# Patient Record
Sex: Female | Born: 1990 | Hispanic: Yes | Smoking: Never smoker
Health system: Southern US, Community
[De-identification: ages and names within clinical notes are randomized; demographics above are authoritative.]

## PROBLEM LIST (undated history)

## (undated) DIAGNOSIS — F329 Major depressive disorder, single episode, unspecified: Secondary | ICD-10-CM

## (undated) DIAGNOSIS — F32A Depression, unspecified: Secondary | ICD-10-CM

## (undated) HISTORY — DX: Depression, unspecified: F32.A

## (undated) HISTORY — PX: NO PAST SURGERIES: SHX2092

## (undated) HISTORY — DX: Major depressive disorder, single episode, unspecified: F32.9

---

## 2017-12-08 ENCOUNTER — Ambulatory Visit: Payer: Self-pay | Admitting: Osteopathic Medicine

## 2017-12-08 ENCOUNTER — Ambulatory Visit (INDEPENDENT_AMBULATORY_CARE_PROVIDER_SITE_OTHER): Payer: 59 | Admitting: Osteopathic Medicine

## 2017-12-08 ENCOUNTER — Encounter: Payer: Self-pay | Admitting: Osteopathic Medicine

## 2017-12-08 VITALS — BP 135/72 | HR 76 | Temp 98.3°F | Ht 59.06 in | Wt 177.1 lb

## 2017-12-08 DIAGNOSIS — Z0000 Encounter for general adult medical examination without abnormal findings: Secondary | ICD-10-CM | POA: Diagnosis not present

## 2017-12-08 DIAGNOSIS — Z113 Encounter for screening for infections with a predominantly sexual mode of transmission: Secondary | ICD-10-CM

## 2017-12-08 DIAGNOSIS — F339 Major depressive disorder, recurrent, unspecified: Secondary | ICD-10-CM | POA: Diagnosis not present

## 2017-12-08 MED ORDER — FLUOXETINE HCL 20 MG PO CAPS: 20.0000 mg | ORAL_CAPSULE | Freq: Every day | ORAL | 3 refills | Status: DC

## 2017-12-08 NOTE — Patient Instructions (Signed)
Need Pap smear / woman exam Need to check how medications are working
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
## 2017-12-09 LAB — COMPLETE METABOLIC PANEL WITH GFR
AG RATIO: 1.3 (calc) (ref 1.0–2.5)
ALBUMIN MSPROF: 4.4 g/dL (ref 3.6–5.1)
ALT: 15 U/L (ref 6–29)
AST: 17 U/L (ref 10–30)
Alkaline phosphatase (APISO): 63 U/L (ref 33–115)
BUN: 11 mg/dL (ref 7–25)
CHLORIDE: 104 mmol/L (ref 98–110)
CO2: 28 mmol/L (ref 20–32)
Calcium: 9.5 mg/dL (ref 8.6–10.2)
Creat: 0.84 mg/dL (ref 0.50–1.10)
GFR, EST AFRICAN AMERICAN: 112 mL/min/{1.73_m2} (ref >=60)
GFR, Est Non African American: 97 mL/min/{1.73_m2} (ref >=60)
Globulin: 3.3 g/dL (calc) (ref 1.9–3.7)
Glucose, Bld: 103 mg/dL (ref 65–139)
POTASSIUM: 4.7 mmol/L (ref 3.5–5.3)
SODIUM: 141 mmol/L (ref 135–146)
TOTAL PROTEIN: 7.7 g/dL (ref 6.1–8.1)
Total Bilirubin: 0.4 mg/dL (ref 0.2–1.2)

## 2017-12-09 LAB — HEPATITIS B CORE ANTIBODY, TOTAL: HEP B C TOTAL AB: NONREACTIVE

## 2017-12-09 LAB — CBC
HCT: 39 % (ref 35.0–45.0)
HEMOGLOBIN: 12.4 g/dL (ref 11.7–15.5)
MCH: 23.4 pg — ABNORMAL LOW (ref 27.0–33.0)
MCHC: 31.8 g/dL — AB (ref 32.0–36.0)
MCV: 73.7 fL — AB (ref 80.0–100.0)
MPV: 10.4 fL (ref 7.5–12.5)
Platelets: 352 10*3/uL (ref 140–400)
RBC: 5.29 10*6/uL — AB (ref 3.80–5.10)
RDW: 15.6 % — ABNORMAL HIGH (ref 11.0–15.0)
WBC: 5 10*3/uL (ref 3.8–10.8)

## 2017-12-09 LAB — HIV ANTIBODY (ROUTINE TESTING W REFLEX): HIV 1&2 Ab, 4th Generation: NONREACTIVE

## 2017-12-09 LAB — C. TRACHOMATIS/N. GONORRHOEAE RNA
C. TRACHOMATIS RNA, TMA: NOT DETECTED
N. GONORRHOEAE RNA, TMA: NOT DETECTED

## 2017-12-09 LAB — LIPID PANEL
Cholesterol: 176 mg/dL (ref <200)
HDL: 56 mg/dL (ref >50)
LDL Cholesterol (Calc): 93 mg/dL (calc)
Non-HDL Cholesterol (Calc): 120 mg/dL (calc) (ref <130)
Total CHOL/HDL Ratio: 3.1 (calc) (ref <5.0)
Triglycerides: 160 mg/dL — ABNORMAL HIGH (ref <150)

## 2017-12-09 LAB — HEPATITIS C ANTIBODY
HEP C AB: NONREACTIVE
SIGNAL TO CUT-OFF: 0.03 (ref <1.00)

## 2017-12-09 LAB — HEPATITIS B SURFACE ANTIGEN: Hepatitis B Surface Ag: NONREACTIVE

## 2017-12-09 LAB — RPR: RPR Ser Ql: NONREACTIVE

## 2018-01-10 ENCOUNTER — Ambulatory Visit (INDEPENDENT_AMBULATORY_CARE_PROVIDER_SITE_OTHER): Payer: 59 | Admitting: Osteopathic Medicine

## 2018-01-10 ENCOUNTER — Encounter: Payer: Self-pay | Admitting: Osteopathic Medicine

## 2018-01-10 VITALS — BP 122/93 | HR 62 | Temp 98.0°F | Wt 176.1 lb

## 2018-01-10 DIAGNOSIS — F339 Major depressive disorder, recurrent, unspecified: Secondary | ICD-10-CM

## 2018-01-10 MED ORDER — FLUOXETINE HCL 20 MG PO CAPS: 20.0000 mg | ORAL_CAPSULE | Freq: Every day | ORAL | 3 refills | Status: DC

## 2018-01-10 NOTE — Progress Notes (Signed)
HPI: Martha Williams is a 27 y.o. female who  has no past medical history on file.  [facility], 01/10/18,  for chief complaint of: Follow-up depression Biometric screening form  Patient arrived late for visit, we were going to do a Pap today but we do not have time for this, was explained to the patient that I did want to see how her moods were doing on the fluoxetine. She states that she is feeling a lot better on the medication and would like to continue at current dose.   Translation assistance with help of Martha Williams, CMA  Past medical, surgical, social and family history reviewed:  Patient Active Problem List   Diagnosis Date Noted  . Depression, recurrent (HCC) 12/08/2017    No past surgical history on file.  Social History   Tobacco Use  . Smoking status: Never Smoker  . Smokeless tobacco: Never Used  Substance Use Topics  . Alcohol use: Yes    Frequency: Never    Comment: socially    Family History  Problem Relation Age of Onset  . High blood pressure Mother   . Cancer Maternal Grandmother      Current medication list and allergy/intolerance information reviewed:    Current Outpatient Medications  Medication Sig Dispense Refill  . FLUoxetine (PROZAC) 20 MG capsule Take 1 capsule (20 mg total) by mouth daily. 90 capsule 3   No current facility-administered medications for this visit.     No Known Allergies    Review of Systems:  Constitutional:  No recent illness  Cardiac: No  chest pain  Respiratory:  No  shortness of breath  Psychiatric: No  concerns with depression, No  concerns with anxiety, No sleep problems, No mood problems  Exam:  BP (!) 122/93   Pulse 62   Temp 98 F (36.7 C) (Oral)   Wt 176 lb 1.9 oz (79.9 kg)   LMP 01/03/2018   BMI 35.51 kg/m   Constitutional: VS see above. General Appearance: alert, well-developed, well-nourished, NAD  Eyes: Normal lids and  conjunctive, non-icteric sclera  Ears, Nose, Mouth, Throat: MMM, Normal external inspection ears/nares/mouth/lips/gums.  Neck: No masses, trachea midline.   Respiratory: Normal respiratory effort.   Musculoskeletal: Gait normal.   Neurological: Normal balance/coordination. No tremor. Marland Kitchen.    Psychiatric: Normal judgment/insight. Normal mood and affect. Oriented x3.   [facility] 01/10/2018 12/08/2017  Decreased Interest 2 2  Down, Depressed, Hopeless 0 2  PHQ - 2 Score 2 4  Altered sleeping 1 0  Tired, decreased energy 2 1  Change in appetite 1 1  Feeling bad or failure about yourself  0 1  Trouble concentrating 0 0  Moving slowly or fidgety/restless 1 1  Suicidal thoughts 0 1  PHQ-9 Score 7 9        ASSESSMENT/PLAN:   Depression, recurrent (HCC)   Meds ordered this encounter  Medications  . FLUoxetine (PROZAC) 20 MG capsule    Sig: Take 1 capsule (20 mg total) by mouth daily.    Dispense:  90 capsule    Refill:  3    Please cancel 30 days supply, thakns     Filled out biometric form, translation assistance from CMA. Copy to patient, we faxed the original    Visit summary with medication list and pertinent instructions was printed for patient to review. All questions at time of visit were answered - patient instructed to contact office with  any additional concerns. ER/RTC precautions were reviewed with the patient.   Follow-up plan: Return for Pap smear .  Note: Total time spent 15 minutes, greater than 50% of the visit was spent face-to-face counseling and coordinating care for the following: The encounter diagnosis was Depression, recurrent (HCC)..  Please note: voice recognition software was used to produce this document, and typos may escape review. Please contact Dr. Lyn Hollingshead for any needed clarifications.

## 2018-01-19 ENCOUNTER — Ambulatory Visit: Payer: Self-pay | Admitting: Osteopathic Medicine

## 2018-01-20 ENCOUNTER — Ambulatory Visit: Payer: 59 | Admitting: Osteopathic Medicine

## 2018-01-20 DIAGNOSIS — Z0189 Encounter for other specified special examinations: Secondary | ICD-10-CM

## 2018-05-23 ENCOUNTER — Emergency Department (INDEPENDENT_AMBULATORY_CARE_PROVIDER_SITE_OTHER)
Admission: EM | Admit: 2018-05-23 | Discharge: 2018-05-23 | Disposition: A | Discharge status: Home / Self Care | Payer: 59 | Source: Home / Self Care | Attending: Family Medicine | Admitting: Family Medicine

## 2018-05-23 DIAGNOSIS — L298 Other pruritus: Secondary | ICD-10-CM

## 2018-05-23 MED ORDER — TRIAMCINOLONE ACETONIDE 0.1 % EX CREA
1.0000 | TOPICAL_CREAM | Freq: Two times a day (BID) | CUTANEOUS | 0 refills | Status: DC
Start: 2018-05-23 — End: 2020-06-23

## 2018-05-23 MED ORDER — CETIRIZINE HCL 10 MG PO CHEW: 10.0000 mg | CHEWABLE_TABLET | Freq: Every day | ORAL | 1 refills | Status: AC

## 2018-05-23 NOTE — ED Provider Notes (Signed)
[facility]    CSN: [id number] Arrival date & time: 05/23/18  1014     History   Chief Complaint Chief Complaint  Patient presents with  . Rash    HPI Memory Martha Williams is a 27 y.o. female.  Spanish speaking. Language interpreter used throughout entire visit.   HPI  Memory Martha Williams is a 27 y.o. female presenting to UC with c/o burning itching rash to the back of her thighs that started 2 days ago after sitting outside. She did not see any bugs.  Itching and burning is moderate in severity. She has not tried any medications for her symptoms. Denies new soaps, lotions, or medications.    History reviewed. No pertinent past medical history.  Patient Active Problem List   Diagnosis Date Noted  . Depression, recurrent (HCC) 12/08/2017    History reviewed. No pertinent surgical history.  OB History   None      Home Medications    Prior to Admission medications   Medication Sig Start Date End Date Taking? Authorizing Provider  cetirizine (ZYRTEC) 10 MG chewable tablet Chew 1 tablet (10 mg total) by mouth daily. 05/23/18   Lurene ShadowPhelps, Lynasia Meloche O, PA-C  FLUoxetine (PROZAC) 20 MG capsule Take 1 capsule (20 mg total) by mouth daily. 01/10/18   Sunnie NielsenAlexander, Natalie, DO  triamcinolone cream (KENALOG) 0.1 % Apply 1 application topically 2 (two) times daily. 05/23/18   Lurene ShadowPhelps, Betty Daidone O, PA-C    Family History Family History  Problem Relation Age of Onset  . High blood pressure Mother   . Cancer Maternal Grandmother     Social History Social History   Tobacco Use  . Smoking status: Never Smoker  . Smokeless tobacco: Never Used  Substance Use Topics  . Alcohol use: Yes    Frequency: Never    Comment: socially  . Drug use: No     Allergies   Patient has no known allergies.   Review of Systems Review of Systems  Musculoskeletal: Negative for arthralgias and myalgias.  Skin: Positive for color change and rash. Negative for wound.     Physical Exam Triage  Vital Signs ED Triage Vitals  Enc Vitals Group     BP      Pulse      Resp      Temp      Temp src      SpO2      Weight      Height      Head Circumference      Peak Flow      Pain Score      Pain Loc      Pain Edu?      Excl. in GC?    No data found.  Updated Vital Signs BP 130/80 (BP Location: Right Arm)   Pulse 61   Temp 98.8 F (37.1 C) (Oral)   Ht 5\' 3"  (1.6 m)   Wt 182 lb (82.6 kg)   LMP 05/02/2018   SpO2 100%   BMI 32.24 kg/m   Visual Acuity Right Eye Distance:   Left Eye Distance:   Bilateral Distance:    Right Eye Near:   Left Eye Near:    Bilateral Near:     Physical Exam  Constitutional: She is oriented to person, place, and time. She appears well-developed and well-nourished. No distress.  HENT:  Head: Normocephalic and atraumatic.  Eyes: EOM are normal.  Neck: Normal range of motion.  Cardiovascular: Normal rate.  Pulmonary/Chest: Effort normal.  Musculoskeletal: Normal range of motion.  Neurological: She is alert and oriented to person, place, and time.  Skin: Skin is warm and dry. Rash noted. She is not diaphoretic. There is erythema.  Bilateral thighs, posterior aspect: diffuser erythematous papular rash. Rash does blanch. Non-tender. No bleeding or discharge.  Psychiatric: She has a normal mood and affect. Her behavior is normal.  Nursing note and vitals reviewed.    UC Treatments / Results  Labs (all labs ordered are listed, but only abnormal results are displayed) Labs Reviewed - No data to display  EKG None  Radiology No results found.  Procedures Procedures (including critical care time)  Medications Ordered in UC Medications - No data to display  Initial Impression / Assessment and Plan / UC Course  I have reviewed the triage vital signs and the nursing notes.  Pertinent labs & imaging results that were available during my care of the patient were reviewed by me and considered in my medical decision making (see chart  for details).     Hx and exam c/w insect bites. No evidence of underlying bacterial infection.  Will tx symptomatically  Final Clinical Impressions(s) / UC Diagnoses   Final diagnoses:  Pruritic erythematous rash     Discharge Instructions      Please take medication as prescribed and follow up with family medicine in 1 week if not improving, sooner if worsening.    ED Prescriptions    Medication Sig Dispense Auth. Provider   triamcinolone cream (KENALOG) 0.1 % Apply 1 application topically 2 (two) times daily. 30 g Lurene Shadow, PA-C   cetirizine (ZYRTEC) 10 MG chewable tablet Chew 1 tablet (10 mg total) by mouth daily. 30 tablet Lurene Shadow, PA-C     Controlled Substance Prescriptions [place] Controlled Substance Registry consulted? Not Applicable   Rolla Plate 05/23/18 1109

## 2018-05-23 NOTE — ED Triage Notes (Signed)
Pt has has a rash on the back of upper thighs that started Sunday.  She was sitting outside and thinks it is insect bites.  Has not used any medication, no one else she has been in contact with has it, and no new soaps or lotions.  Used inter. [place] [phone]#[id number].

## 2018-05-23 NOTE — Discharge Instructions (Signed)
°  Please take medication as prescribed and follow up with family medicine in 1 week if not improving, sooner if worsening.

## 2018-06-14 ENCOUNTER — Ambulatory Visit (INDEPENDENT_AMBULATORY_CARE_PROVIDER_SITE_OTHER): Payer: 59 | Admitting: Physician Assistant

## 2018-06-14 ENCOUNTER — Encounter: Payer: Self-pay | Admitting: Physician Assistant

## 2018-06-14 VITALS — BP 125/85 | HR 61 | Temp 98.4°F | Wt 184.0 lb

## 2018-06-14 DIAGNOSIS — H8110 Benign paroxysmal vertigo, unspecified ear: Secondary | ICD-10-CM

## 2018-06-14 MED ORDER — MECLIZINE HCL 25 MG PO CHEW: 25.0000 mg | CHEWABLE_TABLET | Freq: Three times a day (TID) | ORAL | 0 refills | Status: AC | PRN

## 2018-06-14 NOTE — Progress Notes (Signed)
HPI:                                                                Martha Williams is a 27 y.o. female who [facility] today for dizziness  Spanish speaking patient. Female partner is acting as Equities traderinterpreter. Patient waived right to certified interpreter.  New onset dizziness beginning 4 days ago. Describes as spinning associated with nausea Dizziness is coming in episodes, lasts 3-4 hours Chills began today. Associated with fatigue/tiredness Triggered by head movements and position changes, such as rolling over in bed. Worse with standing Denies hearing difficulties/changes, tinnitus Denies headache, neck stiffness, vision change, diplopia, lightheadedness, syncope or gait disturbance  Past Medical History:  Diagnosis Date  . Depression    History reviewed. No pertinent surgical history. Social History   Tobacco Use  . Smoking status: Never Smoker  . Smokeless tobacco: Never Used  Substance Use Topics  . Alcohol use: Yes    Frequency: Never    Comment: socially   family history includes Cancer in her maternal grandmother; High blood pressure in her mother.    ROS: negative except as noted in the HPI  Medications: Current Outpatient Medications  Medication Sig Dispense Refill  . cetirizine (ZYRTEC) 10 MG chewable tablet Chew 1 tablet (10 mg total) by mouth daily. 30 tablet 1  . Meclizine HCl 25 MG CHEW Chew 1-2 tablets (25-50 mg total) by mouth every 8 (eight) hours as needed for up to 3 days (dizziness). 20 each 0  . triamcinolone cream (KENALOG) 0.1 % Apply 1 application topically 2 (two) times daily. 30 g 0   No current facility-administered medications for this visit.    No Known Allergies     Objective:  BP 125/85   Pulse 61   Temp 98.4 F (36.9 C) (Oral)   Wt 184 lb (83.5 kg)   BMI 32.59 kg/m  Gen: well-groomed, not ill-appearing, no acute distress, obese female HEENT: head normocephalic,  atraumatic; conjunctiva and cornea clear, oropharynx clear, moist mucus membranes; neck supple, no meningeal signs Pulm: Normal work of breathing, normal phonation Neuro:  cranial nerves II-XII intact, no nystagmus, normal finger-to-nose, normal heel-to-shin, normal rapid alternating movements,  normal tone, no tremor; [employer] Hallpike deferred due to severe nausea and dizziness MSK: strength 5/5 and symmetric in bilateral upper and lower extremities, normal gait and station, negative Romberg Mental Status: alert and oriented x 3, speech articulate    Orthostatic VS for the past 24 hrs (Last 3 readings):  BP- Lying Pulse- Lying BP- Sitting Pulse- Sitting BP- Standing at 0 minutes Pulse- Standing at 0 minutes  06/14/18 1429 134/82 67 136/76 71 136/75 75      No results found for this or any previous visit (from the past 72 hour(s)). No results found.    Assessment and Plan: 27 y.o. female with   Benign paroxysmal positional vertigo, unspecified laterality - Plan: Meclizine HCl 25 MG CHEW - benign neuro exam. No red flag symptoms. Unable to perform [employer] Hallpike due to severe dizziness/nausea - instructed on performing Epley maneuver at home - Meclizine 25-50 mg Q8H prn for no more than 3 days  Patient education and anticipatory guidance given Patient agrees with treatment plan Follow-up  as needed if symptoms worsen or fail to improve  Levonne Hubert PA-C

## 2018-06-14 NOTE — Patient Instructions (Signed)
Maniobra de Engineer, petroleum, cuidado personal Immunologist Self-Care) QU ES LA MANIOBRA DE EPLEY? La Yahoo de Epley es un ejercicio que puede Optometrist para Public house manager los sntomas del vrtigo posicional paroxstico benigno (VPPB). Esta afeccin a menudo se conoce como vrtigo. El movimiento de unos pequeos cristales (canalculos) dentro del odo interno ocasiona el VPPB. La acumulacin y el movimiento de los canalculos en el odo interno ocasionan una repentina sensacin de aceleracin (vrtigo) cuando se mueve la cabeza en ciertas posiciones. El vrtigo por lo general dura unos 30das. El VPPB normalmente ocurre solo en un odo. Si siente vrtigo cuando se recuesta sobre el lado izquierdo, probablemente tenga VPPB en el odo izquierdo. El mdico le puede decir qu odo est involucrado. Una lesin en la cabeza puede ocasionar el VPPB. Muchas personas de ms de 50aos tienen VPPB por motivos desconocidos. Si se le diagnostic VPPB, el mdico puede ensearle a Runner, broadcasting/film/video. El VPPB no es potencialmente mortal (benigno) y normalmente se pasa con el tiempo. [place]? Puede realizar [employer] en su casa cuando tenga los sntomas de vrtigo. Puede realizar Neurosurgeon de Eple[place] hasta 3veces en un da hasta que los sntomas de vrtigo desaparezcan. [place] DE EP[place]? 1. Sintese en el borde de una cama o una mesa con la espalda recta. Las piernas deben estar extendidas o colgando sobre el borde de la cama o la mesa. 2. Gire la cabeza a medias hacia el lado del odo afectado. 3. Recustese hacia atrs con la cabeza girada hasta que se encuentre recostado sobre la espalda. Quizs quiera colocar una almohada debajo de los hombros. 4. Mantenga esta posicin durante 30segundos. Es posible que experimente un ataque de vrtigo. Esto es normal. Mantenga esta posicin hasta que el vrtigo desaparezca. 5. Luego gire la cabeza en direccin opuesta  hasta que el odo no afectado est orientado al suelo. 6. Mantenga esta posicin durante 30segundos. Es posible que experimente un ataque de vrtigo. Esto es normal. Mantenga esta posicin hasta que el vrtigo desaparezca. 7. Ahora gire todo el cuerpo hacia el mismo lado que la cabeza. Mantenga esta posicin durante otros 30segundos. 8. Luego, vuelva a sentarse.  ESTA MANIOBRA PRESENTA RIESGOS? En algunos casos, puede tener otros sntomas (como cambios en la visin, debilidad o entumecimiento). Si tiene estos sntomas, deje de Statistician y llame al mdico. Memory Dance si realizar esta maniobra lo [place] del vrtigo, es posible que sienta mareos. El mareo es la sensacin de desvanecimiento pero sin la sensacin de [place]. Aunque la Yahoo de Engineer, petroleum lo [place] del vrtigo, es posible que los sntomas vuelvan durante los siguientes 5aos. QU DEBO HACER DESPUS DE ESTA [place]? Puede retomar sus actividades normales despus de Optometrist la [place] de Engineer, petroleum. Pregntele al mdico si debe hacer algo en su casa para prevenir el vrtigo. Esta puede incluir:  Dormir con dos o ms almohadas para Theatre manager la cabeza elevada.  No dormir sobre el lado del odo afectado.  Levantarse lentamente de la cama.  Evitar los movimientos repentinos Agricultural consultant.  Evitar los movimientos de cabeza intensos, como mirar hacia arriba o Office manager.  Utilizar un collar cervical para evitar los movimientos de cabeza repentinos. [place] SI LOS SNTOMAS EMPEORAN? Llame al mdico si el vrtigo empeora. Llame al mdico inmediatamente si tiene otros sntomas, incluidos:  Nuseas.  Vmitos.  Dolor de [place].  Debilidad.  Entumecimiento.  Cambios en la visin. Esta informacin no tiene Marine scientist  el consejo del mdico. Asegrese de hacerle al mdico cualquier pregunta que tenga. Document Released: 11/06/2013 Document Revised: 11/06/2013 Document Reviewed: 09/25/2013 Elsevier  Interactive Patient Education  2017 Elsevier Inc. Vrtigo posicional benigno (Benign Positional Vertigo) El vrtigo es la sensacin de que usted o todo lo que lo rodea se mueven cuando en realidad eso no sucede. El vrtigo posicional benigno es el tipo de vrtigo ms comn. La causa de este trastorno no es grave (es benigna). Algunos movimientos y determinadas posiciones pueden desencadenar el trastorno (es posicional). El vrtigo posicional benigno puede ser peligroso si ocurre mientras est haciendo algo que podra suponer un riesgo para usted y para los dems, por ejemplo, conduciendo un automvil. CAUSAS En muchos de los casos, se desconoce la causa de este trastorno. Puede deberse a una alteracin en una zona del odo interno que ayuda al cerebro a percibir el movimiento y a coordinar el equilibrio. Esta alteracin puede deberse a una infeccin viral (laberintitis), a una lesin en la cabeza o a los movimientos reiterados. FACTORES DE RIESGO Es ms probable que esta afeccin se manifieste en:  Las mujeres.  Las personas mayores de 50aos. SNTOMAS Generalmente, los sntomas de este trastorno se presentan al mover la cabeza o los ojos en diferentes direcciones. Pueden aparecer repentinamente y suelen durar menos de un minuto. Entre los sntomas se pueden incluir los siguientes:  Prdida del equilibrio y cadas.  Sensacin de estar dando vueltas o movindose.  Sensacin de que el entorno est dando vueltas o movindose.  Nuseas y vmitos.  Visin borrosa.  Mareos.  Movimientos oculares involuntarios (nistagmo). Los sntomas pueden ser leves y algo fastidiosos, o pueden ser graves e interferir en la vida cotidiana. Los episodios de vrtigo posicional benigno pueden repetirse (ser recurrentes) a lo largo del tiempo, y algunos movimientos pueden desencadenarlos. Los sntomas pueden mejorar con el tiempo. DIAGNSTICO Generalmente, este trastorno se diagnostica con una historia clnica  y un examen fsico de la cabeza, el cuello y los odos. Tal vez lo deriven a un mdico especialista en problemas de la garganta, la nariz y el odo (otorrinolaringlogo), o a uno que se especializa en trastornos del sistema nervioso (neurlogo). Pueden hacerle otros estudios, entre ellos:  Resonancia magntica.  Tomografa computarizada.  Estudios de los movimientos oculares. El mdico puede pedirle que cambie rpidamente de posicin mientras observa si se presentan sntomas de vrtigo posicional benigno, por ejemplo, nistagmo. Los movimientos oculares se pueden estudiar con una electronistagmografa (ENG), con estimulacin trmica, mediante la maniobra de [place] o con la prueba de rotacin.  Electroencefalograma (EEG). Este estudio registra la actividad elctrica del cerebro.  Pruebas de audicin. TRATAMIENTO Generalmente, para tratar este trastorno, el mdico le har movimientos especficos con la cabeza para que el odo interno se normalice. Cuando los casos son graves, tal vez haya que realizar una ciruga, pero esto no es frecuente. En algunos casos, el vrtigo posicional benigno se resuelve por s solo en el trmino de 2 o 4semanas. INSTRUCCIONES PARA EL CUIDADO EN EL HOGAR Seguridad  Muvase lentamente.No haga movimientos bruscos con el cuerpo o con la cabeza.  No conduzca.  No opere maquinaria pesada.  No haga ninguna tarea que podra ser peligrosa para usted o para otras personas en caso de que ocurriera un episodio de vrtigo.  Si tiene dificultad para caminar o mantener el equilibrio, use un bastn para mantener la estabilidad. Si se siente mareado o inestable, sintese de inmediato.  Reanude sus actividades normales como se lo haya indicado el   mdico. Pregntele al mdico qu actividades son seguras para usted. Instrucciones generales  Tome los medicamentos de venta libre y los recetados solamente como se lo haya indicado el mdico.  Evite algunas posiciones o  determinados movimientos como se lo haya indicado el mdico.  Beba suficiente lquido para mantener la orina clara o de color amarillo plido.  Concurra a todas las visitas de control como se lo haya indicado el mdico. Esto es importante. SOLICITE ATENCIN MDICA SI:  Tiene fiebre.  El trastorno empeora, o le aparecen sntomas nuevos.  Sus familiares o amigos advierten cambios en su comportamiento.  Las nuseas o los vmitos empeoran.  Tiene sensacin de hormigueo o de adormecimiento. SOLICITE ATENCIN MDICA DE INMEDIATO SI:  Tiene dificultad para hablar o para moverse.  Esta mareado todo el tiempo.  Se desmaya.  Tiene dolores de cabeza intensos.  Tiene debilidad en los brazos o las piernas.  Tiene cambios en la audicin o la visin.  Siente rigidez en el cuello.  Tiene sensibilidad a la luz. Esta informacin no tiene como fin reemplazar el consejo del mdico. Asegrese de hacerle al mdico cualquier pregunta que tenga. Document Released: 02/17/2009 Document Revised: 07/23/2015 Document Reviewed: 02/24/2015 Elsevier Interactive Patient Education  2018 Elsevier Inc.  

## 2019-02-07 ENCOUNTER — Encounter: Payer: 59 | Admitting: Osteopathic Medicine

## 2019-02-16 ENCOUNTER — Encounter: Payer: 59 | Admitting: Osteopathic Medicine

## 2019-03-30 ENCOUNTER — Encounter: Payer: 59 | Admitting: Osteopathic Medicine

## 2019-05-01 ENCOUNTER — Encounter: Payer: Self-pay | Admitting: Osteopathic Medicine

## 2019-05-01 ENCOUNTER — Ambulatory Visit (INDEPENDENT_AMBULATORY_CARE_PROVIDER_SITE_OTHER): Payer: 59 | Admitting: Osteopathic Medicine

## 2019-05-01 VITALS — BP 92/79 | HR 69 | Temp 98.8°F | Ht 61.0 in | Wt 200.1 lb

## 2019-05-01 DIAGNOSIS — N941 Unspecified dyspareunia: Secondary | ICD-10-CM

## 2019-05-01 DIAGNOSIS — Z0000 Encounter for general adult medical examination without abnormal findings: Secondary | ICD-10-CM

## 2019-05-01 DIAGNOSIS — Z124 Encounter for screening for malignant neoplasm of cervix: Secondary | ICD-10-CM

## 2019-05-01 DIAGNOSIS — Z113 Encounter for screening for infections with a predominantly sexual mode of transmission: Secondary | ICD-10-CM

## 2019-05-01 DIAGNOSIS — D509 Iron deficiency anemia, unspecified: Secondary | ICD-10-CM

## 2019-05-01 MED ORDER — FAMOTIDINE 40 MG PO TABS: 40.0000 mg | ORAL_TABLET | Freq: Every day | ORAL | 0 refills | Status: DC

## 2019-05-01 MED ORDER — DIAZEPAM 2 MG PO TABS: 2.0000 mg | ORAL_TABLET | Freq: Once | ORAL | 0 refills | Status: AC | PRN

## 2019-05-01 MED ORDER — IBUPROFEN 800 MG PO TABS: 800.0000 mg | ORAL_TABLET | Freq: Three times a day (TID) | ORAL | 0 refills | Status: AC | PRN

## 2019-05-01 NOTE — Progress Notes (Signed)
HPI: Martha Williams is a 28 y.o. female who  has a past medical history of Depression.  she presents to [facility] today, 05/01/19,  for chief complaint of: Annual physical / biometric screening  Needs Pap Concern for acid reflux   Here for routine annual physical, has never had a Pap before so is due for this.  Complains of acid reflux, omeprazole OTC tried but pt reported chest pain and shortness of breath whic resolved when she was not taking this medicine. No FH early MI or other heart disease in parents or siblings. Last episode was about 10 days ago.  When trying to perform pelvic exam, patient had to have [place] stop the procedure due to the pain.  Further questioning, she reports pain with sex and concern with bleeding between her regular periods.  She did not want any contraception, she is open to STD testing.  Translation assistance from Barbarann Ehlers.      .   Past medical, surgical, social and family history reviewed:  Patient Active Problem List   Diagnosis Date Noted  . Benign paroxysmal positional vertigo 06/14/2018  . Depression, recurrent ([place]) 12/08/2017    No past surgical history on file.  Social History   Tobacco Use  . Smoking status: Never Smoker  . Smokeless tobacco: Never Used  Substance Use Topics  . Alcohol use: Yes    Frequency: Never    Comment: socially    Family History  Problem Relation Age of Onset  . High blood pressure Mother   . Cancer Maternal Grandmother      Current medication list and allergy/intolerance information reviewed:    Current Outpatient Medications  Medication Sig Dispense Refill  . cetirizine (ZYRTEC) 10 MG chewable tablet Chew 1 tablet (10 mg total) by mouth daily. 30 tablet 1  . triamcinolone cream (KENALOG) 0.1 % Apply 1 application topically 2 (two) times daily. 30 g 0  . diazepam (VALIUM) 2 MG tablet Take 1-2 tablets (2-4 mg total) by  mouth once as needed (1 hour prior to Pap / pelvic exam). 2 tablet 0  . famotidine (PEPCID) 40 MG tablet Take 1 tablet (40 mg total) by mouth at bedtime. 90 tablet 0  . ibuprofen (ADVIL) 800 MG tablet Take 1 tablet (800 mg total) by mouth every 8 (eight) hours as needed for moderate pain or cramping. 15 tablet 0   No current facility-administered medications for this visit.     No Known Allergies    Review of Systems:  Constitutional:  No  fever, no chills, No recent illness  HEENT: No  headache, no vision change, no hearing change, No sore throat, No  sinus pressure  Cardiac: +chest pain per HPI, No  pressure, No palpitations, No  Orthopnea  Respiratory:  No  shortness of breath. No  Cough  Gastrointestinal: No  abdominal pain, No  nausea, No  vomiting,  No  blood in stool, No  diarrhea, No  Constipation, +GERD  Musculoskeletal: No new myalgia/arthralgia  Skin: No  Rash  Hem/Onc: No  easy bruising/bleeding, No  abnormal lymph node  Endocrine: No cold intolerance,  No heat intolerance. No polyuria/polydipsia/polyphagia   Neurologic: No  weakness, No  dizziness, No  slurred speech/focal weakness/facial droop  Psychiatric: No  concerns with depression, No  concerns with anxiety, No sleep problems, No mood problems  Exam:  BP 92/79 (BP Location: Left Arm, Patient Position: Sitting, Cuff Size: Normal)   Pulse  69   Temp 98.8 F (37.1 C) (Oral)   Ht 5\' 1"  (1.549 m)   Wt 200 lb 1.6 oz (90.8 kg)   BMI 37.81 kg/m   Constitutional: VS see above. General Appearance: alert, well-developed, well-nourished, NAD  Eyes: Normal lids and conjunctive, non-icteric sclera  Neck: No masses, trachea midline. No thyroid enlargement.   Respiratory: Normal respiratory effort. no wheeze, no rhonchi, no rales  Cardiovascular: S1/S2 normal, no murmur, no rub/gallop auscultated. RRR. No lower extremity edema.  Gastrointestinal: Nontender, no masses. No hepatomegaly, no splenomegaly. No hernia  appreciated. Bowel sounds normal. Rectal exam deferred.   Musculoskeletal: Gait normal. No clubbing/cyanosis of digits.   Neurological: Normal balance/coordination. No tremor.  Skin: warm, dry, intact. No rash/ulcer.  Psychiatric: Normal judgment/insight. Normal mood and affect. Oriented x3.  GYN: No lesions/ulcers to external genitalia, normal urethra, normal vaginal mucosa, physiologic discharge, cervix unable to be visualized d/t stopping procedure   BREAST: No rashes/skin changes, normal fibrous breast tissue, no masses or tenderness, normal nipple without discharge, normal axilla    No results found for this or any previous visit (from the past 72 hour(s)).  No results found.           ASSESSMENT/PLAN: The primary encounter diagnosis was Annual physical exam. Diagnoses of Screening for STDs (sexually transmitted diseases) and Cervical cancer screening were also pertinent to this visit.  Will postpone Pap/pelvic and premedicate next time    General Preventive Care  Most recent routine screening lipids/other labs: ordered today  Everyone should have blood pressure checked once per year.  Tobacco: don't!  Alcohol: responsible moderation is ok for most adults - if you have concerns about your alcohol intake, please talk to me!   Exercise: as tolerated to reduce risk of cardiovascular disease and diabetes. Strength training will also prevent osteoporosis.   Mental health: if need for mental health care (medicines, counseling, other), or concerns about moods, please let me know!   Sexual health: if need for STD testing, or if concerns with libido/pain problems, please let me know! If you need to discuss your birth control options, please let me know!   Advanced Directive: Living Will and/or Healthcare Power of Attorney recommended for all adults, regardless of age or health.  Vaccines  Flu vaccine: recommended for almost everyone, every fall.   Tetanus booster:  Tdap recommended every 10 years.   HPV vaccine: Gardasil recommended up to age 27 to prevent HPV-associated diseases, including certain cancers.  Cancer screenings   Colon cancer screening: recommended for everyone at age 27  Breast cancer screening: mammogram recommended at age 27 every other year at least, and annually after age 27.   Cervical cancer screening: Pap every 1 to 5 years depending on age and other risk factors. Can usually stop at age 27 or w/ hysterectomy.   Lung cancer screening: not needed for non-smokers  Infection screenings . HIV: recommended screening at least once age 27-65, more often as needed. . Gonorrhea/Chlamydia: screening as needed, though many insurances require testing for anyone on birth control pills. . Hepatitis C: recommended for anyone born [id number] . TB: certain at-risk populations, or depending on work requirements and/or travel history Other . Bone Density Test: recommended for women at age 27   Please note: Preventive care issues were addressed today as part of your annual wellness physical, and this care should be covered under your insurance. However there were other medical issues which were also addressed today, and insurance may bill you  separately for a "problem-based visit" for this care: chest pain, heartburn. Any questions or concerns about charges which may appear on your billing statement should be directed to your insurance company or to [facility] billing department, and they will contact our office if there are further concerns.         Orders Placed This Encounter  Procedures  . C. trachomatis/N. gonorrhoeae RNA  . WET PREP FOR TRICH, YEAST, CLUE  . CBC  . COMPLETE METABOLIC PANEL WITH GFR  . Lipid panel  . HIV Antibody (routine testing w rflx)  . RPR  . Hepatitis B surface antibody,qualitative  . Hepatitis B core antibody, total  . Hepatitis C antibody    Meds ordered this encounter  Medications  . diazepam  (VALIUM) 2 MG tablet    Sig: Take 1-2 tablets (2-4 mg total) by mouth once as needed (1 hour prior to Pap / pelvic exam).    Dispense:  2 tablet    Refill:  0  . ibuprofen (ADVIL) 800 MG tablet    Sig: Take 1 tablet (800 mg total) by mouth every 8 (eight) hours as needed for moderate pain or cramping.    Dispense:  15 tablet    Refill:  0  . famotidine (PEPCID) 40 MG tablet    Sig: Take 1 tablet (40 mg total) by mouth at bedtime.    Dispense:  90 tablet    Refill:  0          Visit summary with medication list and pertinent instructions was printed for patient to review. All questions at time of visit were answered - patient instructed to contact office with any additional concerns or updates. ER/RTC precautions were reviewed with the patient.     Please note: voice recognition software was used to produce this document, and typos may escape review. Please contact Dr. Lyn HollingsheadAlexander for any needed clarifications.     Follow-up plan: Return in about 1 week (around 05/08/2019) for annual / Pap.

## 2019-05-02 LAB — CBC
HCT: 36.1 % (ref 35.0–45.0)
Hemoglobin: 11.3 g/dL — ABNORMAL LOW (ref 11.7–15.5)
MCH: 22.4 pg — ABNORMAL LOW (ref 27.0–33.0)
MCHC: 31.3 g/dL — ABNORMAL LOW (ref 32.0–36.0)
MCV: 71.5 fL — ABNORMAL LOW (ref 80.0–100.0)
MPV: 10.1 fL (ref 7.5–12.5)
Platelets: 413 10*3/uL — ABNORMAL HIGH (ref 140–400)
RBC: 5.05 10*6/uL (ref 3.80–5.10)
RDW: 15.7 % — ABNORMAL HIGH (ref 11.0–15.0)
WBC: 6.2 10*3/uL (ref 3.8–10.8)

## 2019-05-02 LAB — LIPID PANEL
Cholesterol: 138 mg/dL (ref <200)
HDL: 42 mg/dL — ABNORMAL LOW (ref >=50)
LDL Cholesterol (Calc): 76 mg/dL (calc)
Non-HDL Cholesterol (Calc): 96 mg/dL (calc) (ref <130)
Total CHOL/HDL Ratio: 3.3 (calc) (ref <5.0)
Triglycerides: 123 mg/dL (ref <150)

## 2019-05-02 LAB — COMPLETE METABOLIC PANEL WITH GFR
AG Ratio: 1.4 (calc) (ref 1.0–2.5)
ALT: 30 U/L — ABNORMAL HIGH (ref 6–29)
AST: 22 U/L (ref 10–30)
Albumin: 4.6 g/dL (ref 3.6–5.1)
Alkaline phosphatase (APISO): 70 U/L (ref 31–125)
BUN: 12 mg/dL (ref 7–25)
CO2: 22 mmol/L (ref 20–32)
Calcium: 9.2 mg/dL (ref 8.6–10.2)
Chloride: 104 mmol/L (ref 98–110)
Creat: 0.97 mg/dL (ref 0.50–1.10)
GFR, Est African American: 93 mL/min/{1.73_m2} (ref >=60)
GFR, Est Non African American: 81 mL/min/{1.73_m2} (ref >=60)
Globulin: 3.2 g/dL (calc) (ref 1.9–3.7)
Glucose, Bld: 86 mg/dL (ref 65–99)
Potassium: 3.9 mmol/L (ref 3.5–5.3)
Sodium: 137 mmol/L (ref 135–146)
Total Bilirubin: 0.3 mg/dL (ref 0.2–1.2)
Total Protein: 7.8 g/dL (ref 6.1–8.1)

## 2019-05-02 LAB — HIV ANTIBODY (ROUTINE TESTING W REFLEX): HIV 1&2 Ab, 4th Generation: NONREACTIVE

## 2019-05-02 LAB — HEPATITIS B SURFACE ANTIBODY,QUALITATIVE: Hep B S Ab: REACTIVE — AB

## 2019-05-02 LAB — HEPATITIS C ANTIBODY
Hepatitis C Ab: NONREACTIVE
SIGNAL TO CUT-OFF: 0.03 (ref <1.00)

## 2019-05-02 LAB — TEST AUTHORIZATION

## 2019-05-02 LAB — IRON,TIBC AND FERRITIN PANEL
%SAT: 6 % (calc) — ABNORMAL LOW (ref 16–45)
Ferritin: 5 ng/mL — ABNORMAL LOW (ref 16–154)
Iron: 27 ug/dL — ABNORMAL LOW (ref 40–190)
TIBC: 437 mcg/dL (calc) (ref 250–450)

## 2019-05-02 LAB — HEPATITIS B CORE ANTIBODY, TOTAL: Hep B Core Total Ab: NONREACTIVE

## 2019-05-02 LAB — RPR: RPR Ser Ql: NONREACTIVE

## 2019-05-02 NOTE — Addendum Note (Signed)
Addended by: Maryla Morrow on: 05/02/2019 08:53 AM   Modules accepted: Orders

## 2019-05-03 ENCOUNTER — Encounter: Payer: Self-pay | Admitting: Osteopathic Medicine

## 2019-05-03 DIAGNOSIS — D509 Iron deficiency anemia, unspecified: Secondary | ICD-10-CM

## 2019-05-03 HISTORY — DX: Iron deficiency anemia, unspecified: D50.9

## 2019-05-03 MED ORDER — FERROUS SULFATE 325 (65 FE) MG PO TBEC: 325.0000 mg | DELAYED_RELEASE_TABLET | Freq: Two times a day (BID) | ORAL | 1 refills | Status: DC

## 2019-05-03 NOTE — Addendum Note (Signed)
Addended by: Maryla Morrow on: 05/03/2019 10:01 AM   Modules accepted: Orders

## 2019-05-04 LAB — IRON,TIBC AND FERRITIN PANEL
%SAT: 7 % (calc) — ABNORMAL LOW (ref 16–45)
Ferritin: 5 ng/mL — ABNORMAL LOW (ref 16–154)
Iron: 28 ug/dL — ABNORMAL LOW (ref 40–190)
TIBC: 412 mcg/dL (calc) (ref 250–450)

## 2019-07-10 ENCOUNTER — Encounter: Payer: Self-pay | Admitting: Osteopathic Medicine

## 2019-07-10 ENCOUNTER — Ambulatory Visit (INDEPENDENT_AMBULATORY_CARE_PROVIDER_SITE_OTHER): Payer: 59 | Admitting: Osteopathic Medicine

## 2019-07-10 VITALS — BP 138/86 | HR 78 | Temp 98.6°F | Wt 195.6 lb

## 2019-07-10 DIAGNOSIS — G4489 Other headache syndrome: Secondary | ICD-10-CM

## 2019-07-10 MED ORDER — AMOXICILLIN-POT CLAVULANATE 875-125 MG PO TABS: 1.0000 | ORAL_TABLET | Freq: Two times a day (BID) | ORAL | 0 refills | Status: AC

## 2019-07-10 MED ORDER — SUMATRIPTAN SUCCINATE 50 MG PO TABS: 50.0000 mg | ORAL_TABLET | ORAL | 0 refills | Status: AC | PRN

## 2019-07-10 NOTE — Patient Instructions (Addendum)
I am going to send in some medication for migraine headache, take 1 pill now and can take another pill in 2 hours if the headache is still there.    Migraines do not usually cause dental pain or pain in the neck, so I am going to send in some antibiotics in case of a possible dental infection or sinus infection, please start this medicine if the migraine pills are not helping.   If headache gets worse even with treatment, please go to the emergency room!     Cefalea migraosa Migraine Headache Una cefalea migraosa es un dolor muy intenso y punzante en uno o ambos lados de la cabeza. Este tipo de dolor de cabeza tambin puede causar otros sntomas. Puede durar desde 4horas hasta 3das. Hable con su mdico sobre las cosas que pueden causar (desencadenar) esta afeccin. Cules son las causas? Se desconoce la causa exacta de esta afeccin. Esta afeccin puede desencadenarse o ser causada por lo siguiente:  Consumo de alcohol.  Consumo de cigarrillos.  Tomar medicamentos como por ejemplo: ? Medicamentos para Best boy torcico (nitroglicerina). ? Anticonceptivos orales. ? Estrgeno. ? Algunos medicamentos para la presin arterial.  Comer o beber ciertos productos.  Hacer actividad fsica. Otros factores que pueden provocar cefalea migraosa son los siguientes:  Tener el perodo menstrual.  Glennis Brink.  Hambre.  Estrs.  No dormir lo suficiente o dormir demasiado.  Cambios climticos.  Cansancio (fatiga). Qu incrementa el riesgo?  Tener entre 25 y 47aos de edad.  Ser mujer.  Tener antecedentes familiares de cefalea migraosa.  Ser de Science writer.  Tener depresin o ansiedad.  Tener mucho sobrepeso. Cules son los signos o los sntomas?  Un dolor punzante. Este dolor puede [employer] siguientes caractersticas: ? Audiological scientist en cualquier regin de la cabeza, tanto de un lado como de [place]. ? Puede dificultar las actividades cotidianas. ? Puede  empeorar con la actividad fsica. ? Puede empeorar con las luces brillantes o los ruidos fuertes.  Otros sntomas pueden incluir: ? Ganas de vomitar (nuseas). ? Vmitos. ? Mareos. ? Sensibilidad a las luces brillantes, los ruidos fuertes o [employer].  Antes de tener una cefalea migraosa, puede recibir seales de advertencia (aura). Un aura puede incluir: ? Ver luces intermitentes o tener puntos ciegos. ? Ver puntos brillantes, halos o lneas en zigzag. ? Tener una visin en tnel o visin borrosa. ? Sentir entumecimiento u hormigueo. ? Tener dificultad para hablar. ? Tener msculos dbiles.  Algunas personas tienen sntomas despus de una cefalea migraosa (fase posdromal), como los siguientes: ? Cansancio. ? Dificultad para pensar (concentrarse). Cmo se trata?  Tomar medicamentos para: ? Best boy. ? Aliviar la sensacin de Higher education careers adviser. ? Prevenir las Psychologist, occupational.  El tratamiento tambin puede incluir lo siguiente: ? Tomar sesiones de acupuntura. ? Evitar los alimentos que provocan las cefaleas migraosas. ? Aprender Orland Jarred de controlar las funciones corporales (biorretroalimentacin). ? Terapia para ayudarlo a Civil engineer, contracting y lidiar con los pensamientos negativos (terapia cognitivo conductual). Siga estas instrucciones en su casa: Medicamentos  [employer] de venta libre y los recetados solamente como se lo haya indicado el mdico.  Consulte a su mdico si el medicamento que le recetaron: ? Hace que sea necesario que evite conducir o usar maquinaria pesada. ? Puede causarle dificultad para defecar (estreimiento). Es posible que deba tomar estas medidas para prevenir o tratar los problemas para defecar:  Electronics engineer suficiente lquido para Contractor pis (la orina) de color amarillo plido.  Tomar medicamentos recetados o de H. J. Heinzventa libre.  Comer alimentos ricos en fibra. Entre ellos, frijoles, cereales integrales y frutas y verduras frescas.   Limitar los alimentos con alto contenido de grasa y International aid/development workerazcar. Estos incluyen alimentos fritos o dulces. Estilo de vida  No beba alcohol.  No consuma ningn producto que contenga nicotina o tabaco, como cigarrillos, cigarrillos electrnicos y tabaco de Theatre managermascar. Si necesita ayuda para dejar de fumar, consulte al mdico.  Duerma como mnimo 8horas todas las noches.  Limite el estrs y [place] [address]. Indicaciones generales      Lleve un registro diario para Financial risk analystaveriguar lo que Advice workerpuede provocar las cefaleas migraosas. Registre, por ejemplo, lo siguiente: ? Lo que usted come y bebe. ? El tiempo que duerme. ? Algn cambio en lo que come o bebe. ? Algn cambio en sus medicamentos.  Si tiene una cefalea migraosa: ? Evite los factores que CSX Corporationempeoren los sntomas, como las luces brillantes. ? Resulta til acostarse en una habitacin oscura y silenciosa. ? No conduzca vehculos ni opere maquinaria pesada. ? Pregntele al mdico qu actividades son seguras para usted.  Concurra a todas las visitas de [address]se[address] como se lo haya indicado el mdico. Esto es importante. Comunquese con un mdico si:  Tiene una cefalea migraosa que es diferente o peor que otras que ha tenido.  Tiene ms de [address]15 das de cefalea por mes. Solicite ayuda inmediatamente si:  La cefalea migraosa The Timken Companyempeora mucho.  La cefalea migraosa dura ms de 72 horas.  Tiene fiebre.  Presenta rigidez en el cuello.  Tiene dificultad para ver.  Siente debilidad en los msculos o que no puede controlarlos.  Comienza a perder el equilibrio continuamente.  Comienza a tener dificultad para caminar.  Pierde el conocimiento (se desmaya).  Tiene una convulsin. Resumen  Burkina Fasona cefalea migraosa es un dolor muy intenso y punzante en uno o ambos lados de la cabeza. Estos dolores de Turkmenistancabeza tambin pueden causar otros sntomas.  Esta afeccin puede tratarse con medicamentos y cambios en el estilo de vida.  Lleve un registro diario para  Financial risk analystaveriguar lo que Advice workerpuede provocar las cefaleas migraosas.  Comunquese con un mdico si tiene una cefalea migraosa que es diferente o peor que otras que ha tenido.  Comunquese con el mdico si tiene ms de 15 das de cefalea en un mes. Esta informacin no tiene Theme park managercomo fin reemplazar el consejo del mdico. Asegrese de hacerle al mdico cualquier pregunta que tenga. Document Released: 01/28/2009 Document Revised: 01/12/2019 Document Reviewed: 01/12/2019 Elsevier Patient Education  2020 [employer]

## 2019-07-10 NOTE — Progress Notes (Signed)
HPI: Martha Williams is a 28 y.o. female who  has a past medical history of Depression and Iron deficiency anemia (05/03/2019).  [facility], 07/10/19,  for chief complaint of:  Headache   . Context: occasional headaches but this is slightly more severe . Location: L head, radiating into L face and jaw line  . Quality: throbbing, pressure . Duration: since yesterday 5:00 PM (now 1:30 PM)  . Modifying factors: tried Advil, helped only minimally  . Assoc signs/symptoms: dental pain on L hind molars top and bottom, denies sinus congestion or ear pain, no sore throat. No stabbing pain. Photosensitivity but no nausea or phonophobia  . Patient denies possibility of pregnancy  Patient is accompanied by husband who assists with history-taking.    At today's visit 07/10/19 ... PMH, PSH, FH reviewed and updated as needed.  Current medication list and allergy/intolerance hx reviewed and updated as needed. (See remainder of HPI, ROS, Phys Exam below)   No results found.  No results found for this or any previous visit (from the past 72 hour(s)).   BP Readings from Last 3 Encounters:  05/01/19 92/79  06/14/18 125/85  05/23/18 130/80        ASSESSMENT/PLAN: The encounter diagnosis was Other headache syndrome. Consistent with migraines but dental/neck pain is also concerning    No orders of the defined types were placed in this encounter.    Meds ordered this encounter  Medications  . SUMAtriptan (IMITREX) 50 MG tablet    Sig: Take 1 tablet (50 mg total) by mouth every 2 (two) hours as needed for migraine. May repeat in 2 hours if headache persists or recurs.    Dispense:  9 tablet    Refill:  0  . amoxicillin-clavulanate (AUGMENTIN) 875-125 MG tablet    Sig: Take 1 tablet by mouth 2 (two) times daily for 10 days. Take if migraine medications aren't helping    Dispense:  20 tablet    Refill:  0    Patient  Instructions  I am going to send in some medication for migraine headache, take 1 pill now and can take another pill in 2 hours if the headache is still there.    Migraines do not usually cause dental pain or pain in the neck, so I am going to send in some antibiotics in case of a possible dental infection or sinus infection, please start this medicine if the migraine pills are not helping.   If headache gets worse even with treatment, please go to the emergency room!     Cefalea migraosa Migraine Headache Una cefalea migraosa es un dolor muy intenso y punzante en uno o ambos lados de la cabeza. Este tipo de dolor de cabeza tambin puede causar otros sntomas. Puede durar desde 4horas hasta 3das. Hable con su mdico sobre las cosas que pueden causar (desencadenar) esta afeccin. Cules son las causas? Se desconoce la causa exacta de esta afeccin. Esta afeccin puede desencadenarse o ser causada por lo siguiente:  Consumo de alcohol.  Consumo de cigarrillos.  Tomar medicamentos como por ejemplo: ? Medicamentos para Engineer, materialsaliviar el dolor torcico (nitroglicerina). ? Anticonceptivos orales. ? Estrgeno. ? Algunos medicamentos para la presin arterial.  Comer o beber ciertos productos.  Hacer actividad fsica. Otros factores que pueden provocar cefalea migraosa son los siguientes:  Tener el perodo menstrual.  Vanetta MuldersEmbarazo.  Hambre.  Estrs.  No dormir lo suficiente o dormir demasiado.  Cambios climticos.  Cansancio (  fatiga). Qu incrementa el riesgo?  Tener entre 25 y 55aos de edad.  Ser mujer.  Tener antecedentes familiares de cefalea migraosa.  Ser de Engineer, manufacturing.  Tener depresin o ansiedad.  Tener mucho sobrepeso. Cules son los signos o los sntomas?  Un dolor punzante. Este dolor puede [employer] siguientes caractersticas: ? Software engineer en cualquier regin de la cabeza, tanto de un lado como de [place]. ? Puede dificultar las actividades  cotidianas. ? Puede empeorar con la actividad fsica. ? Puede empeorar con las luces brillantes o los ruidos fuertes.  Otros sntomas pueden incluir: ? Ganas de vomitar (nuseas). ? Vmitos. ? Mareos. ? Sensibilidad a las luces brillantes, los ruidos fuertes o [employer].  Antes de tener una cefalea migraosa, puede recibir seales de advertencia (aura). Un aura puede incluir: ? Ver luces intermitentes o tener puntos ciegos. ? Ver puntos brillantes, halos o lneas en zigzag. ? Tener una visin en tnel o visin borrosa. ? Sentir entumecimiento u hormigueo. ? Tener dificultad para hablar. ? Tener msculos dbiles.  Algunas personas tienen sntomas despus de una cefalea migraosa (fase posdromal), como los siguientes: ? Cansancio. ? Dificultad para pensar (concentrarse). Cmo se trata?  Tomar medicamentos para: ? Engineer, materials. ? Aliviar la sensacin de Programme researcher, broadcasting/film/video. ? Prevenir las Soil scientist.  El tratamiento tambin puede incluir lo siguiente: ? Tomar sesiones de acupuntura. ? Evitar los alimentos que provocan las cefaleas migraosas. ? Aprender Duaine Dredge de controlar las funciones corporales (biorretroalimentacin). ? Terapia para ayudarlo a Solicitor y lidiar con los pensamientos negativos (terapia cognitivo conductual). Siga estas instrucciones en su casa: Medicamentos  [employer] de venta libre y los recetados solamente como se lo haya indicado el mdico.  Consulte a su mdico si el medicamento que le recetaron: ? Hace que sea necesario que evite conducir o usar maquinaria pesada. ? Puede causarle dificultad para defecar (estreimiento). Es posible que deba tomar estas medidas para prevenir o tratar los problemas para defecar:  Product manager suficiente lquido para Radio producer pis (la orina) de color amarillo plido.  Tomar medicamentos recetados o de [employer].  Comer alimentos ricos en fibra. Entre ellos, frijoles, cereales integrales y frutas y  verduras frescas.  Limitar los alimentos con alto contenido de grasa y International aid/development worker. Estos incluyen alimentos fritos o dulces. Estilo de vida  No beba alcohol.  No consuma ningn producto que contenga nicotina o tabaco, como cigarrillos, cigarrillos electrnicos y tabaco de Theatre manager. Si necesita ayuda para dejar de fumar, consulte al mdico.  Duerma como mnimo 8horas todas las noches.  Limite el estrs y [place]. Indicaciones generales      Lleve un registro diario para Financial risk analyst lo que Advice worker. Registre, por ejemplo, lo siguiente: ? Lo que usted come y bebe. ? El tiempo que duerme. ? Algn cambio en lo que come o bebe. ? Algn cambio en sus medicamentos.  Si tiene una cefalea migraosa: ? Evite los factores que [employer] sntomas, como las luces brillantes. ? Resulta til acostarse en una habitacin oscura y silenciosa. ? No conduzca vehculos ni opere maquinaria pesada. ? Pregntele al mdico qu actividades son seguras para usted.  Concurra a todas las visitas de [address] se lo haya indicado el mdico. Esto es importante. Comunquese con un mdico si:  Tiene una cefalea migraosa que es diferente o peor que otras que ha tenido.  Tiene ms de [address] de cefalea por mes. Solicite ayuda inmediatamente si:  La cefalea migraosa empeora  mucho.  La cefalea migraosa dura ms de 72 horas.  Tiene fiebre.  Presenta rigidez en el cuello.  Tiene dificultad para ver.  Siente debilidad en los msculos o que no puede controlarlos.  Comienza a perder el equilibrio continuamente.  Comienza a tener dificultad para caminar.  Pierde el conocimiento (se desmaya).  Tiene una convulsin. Resumen  Burkina Fasona cefalea migraosa es un dolor muy intenso y punzante en uno o ambos lados de la cabeza. Estos dolores de Turkmenistancabeza tambin pueden causar otros sntomas.  Esta afeccin puede tratarse con medicamentos y cambios en el estilo de vida.  Lleve un  registro diario para Financial risk analystaveriguar lo que Advice workerpuede provocar las cefaleas migraosas.  Comunquese con un mdico si tiene una cefalea migraosa que es diferente o peor que otras que ha tenido.  Comunquese con el mdico si tiene ms de 15 das de cefalea en un mes. Esta informacin no tiene Theme park managercomo fin reemplazar el consejo del mdico. Asegrese de hacerle al mdico cualquier pregunta que tenga. Document Released: 01/28/2009 Document Revised: 01/12/2019 Document Reviewed: 01/12/2019 Elsevier Patient Education  2020 [employer]      Follow-up plan: Return if symptoms worsen or fail to improve.                                                 ################################################# ################################################# ################################################# #################################################    No outpatient medications have been marked as taking for the 07/10/19 encounter (Appointment) with Sunnie NielsenAlexander, Tyger Wichman, DO.    No Known Allergies     Review of Systems:  Constitutional: No recent illness, no fever/chills  HEENT: +headache, no vision change  Cardiac: No  chest pain, No  pressure, No palpitations  Respiratory:  No  shortness of breath. No  Cough  Gastrointestinal: No  abdominal pain, no nausea  Musculoskeletal: No new myalgia/arthralgia  Skin: No  Rash  Neurologic: No  weakness, No  Dizziness  Psychiatric: No  concerns with depression, No  concerns with anxiety  Exam:  BP 138/86 (BP Location: Left Arm, Patient Position: Sitting, Cuff Size: Normal)   Pulse 78   Temp 98.6 F (37 C) (Oral)   Wt 195 lb 9.6 oz (88.7 kg)   BMI 36.96 kg/m   Constitutional: VS see above. General Appearance: alert, well-developed, well-nourished, NAD  Eyes: Normal lids and conjunctive, non-icteric sclera, EOMI, PERRL, light causes increased pain   Ears, Nose, Mouth, Throat: MMM, Normal external  inspection ears/nares/mouth/lips/gums. No apparent dental infection/abscess  Neck: No masses, trachea midline. Some tenderness L mandible and L anterior cervical LN but no LN enlargement, normal activee ROM   Respiratory: Normal respiratory effort. no wheeze, no rhonchi, no rales  Cardiovascular: S1/S2 normal, no murmur, no rub/gallop auscultated. RRR.   Musculoskeletal: Gait normal. Symmetric and independent movement of all extremities  Neurological: Normal balance/coordination. No tremor.  Skin: warm, dry, intact.   Psychiatric: Normal judgment/insight. Normal mood and affect. Oriented x3.       Visit summary with medication list and pertinent instructions was printed for patient to review, patient was advised to alert us if any updates are needed. All questions at time of visit were answered - patient instructed to contact office with any additional concerns. ER/RTC precautions were reviewed with the patient and understanding verbalized.   Note: Total time spent 25 minutes, greater than 50% of the visit was spent face-to-face counseling  and coordinating care for the following: The encounter diagnosis was Other headache syndrome..  Please note: voice recognition software was used to produce this document, and typos may escape review. Please contact Dr. Sheppard Coil for any needed clarifications.    Follow up plan: Return if symptoms worsen or fail to improve.

## 2019-09-24 ENCOUNTER — Telehealth: Payer: Self-pay

## 2019-09-24 NOTE — Telephone Encounter (Signed)
Patient walked into office with husband today with complaints of abdominal pain. Patient denied any fever, chills, cough etc for COVID screen. Patient states pain just started yesterday/today. Only wants to see Dr Sheppard Coil.   Patient asked if she could wait around for Dr Sheppard Coil, I advised her there were no openings today. I advised patient to be seen at urgent care and she declined. Patient scheduled an appt with Dr Sheppard Coil on Wednesday at 2:30 after declining Urgent Care or seeing another provider tomorrow. Patient assured me if any new or worsening of SX she would go to Urgent Care.   FYI to PCP

## 2019-09-24 NOTE — Telephone Encounter (Signed)
Noted  

## 2019-09-26 ENCOUNTER — Ambulatory Visit (INDEPENDENT_AMBULATORY_CARE_PROVIDER_SITE_OTHER): Payer: 59 | Admitting: Osteopathic Medicine

## 2019-09-26 ENCOUNTER — Encounter: Payer: Self-pay | Admitting: Osteopathic Medicine

## 2019-09-26 ENCOUNTER — Other Ambulatory Visit: Payer: Self-pay

## 2019-09-26 VITALS — BP 126/78 | HR 78 | Temp 98.5°F | Wt 198.1 lb

## 2019-09-26 DIAGNOSIS — R1031 Right lower quadrant pain: Secondary | ICD-10-CM | POA: Diagnosis not present

## 2019-09-26 DIAGNOSIS — R932 Abnormal findings on diagnostic imaging of liver and biliary tract: Secondary | ICD-10-CM | POA: Diagnosis not present

## 2019-09-26 DIAGNOSIS — R102 Pelvic and perineal pain: Secondary | ICD-10-CM

## 2019-09-26 LAB — POCT URINE PREGNANCY: Preg Test, Ur: NEGATIVE

## 2019-09-26 LAB — POCT URINALYSIS DIPSTICK
Bilirubin, UA: NEGATIVE
Blood, UA: NEGATIVE
Glucose, UA: NEGATIVE
Ketones, UA: NEGATIVE
Leukocytes, UA: NEGATIVE
Nitrite, UA: NEGATIVE
Protein, UA: NEGATIVE
Spec Grav, UA: 1.025 (ref 1.010–1.025)
Urobilinogen, UA: 0.2 E.U./dL
pH, UA: 7 (ref 5.0–8.0)

## 2019-09-26 NOTE — Patient Instructions (Signed)
Estoy preocupada por el apndice. Vamos a hacer una tomografa computarizada del abdomen para buscar cul es el problema, si es el apndice u [place]. Si su dolor empeora (especialmente si tiene fiebre, nuseas o vmitos), vaya al hospital!

## 2019-09-26 NOTE — Progress Notes (Signed)
HPI: Martha Williams is a 28 y.o. female who  has a past medical history of Depression and Iron deficiency anemia (05/03/2019).  she presents to [facility] today, 09/26/19,  for chief complaint of:  Lower abdominal pain    . Context: walked into clinic 2 days ago in pain, unable to get her on schedule at that time, patient declined urgent care visit and scheduled visit today.  . Location: middle right side, wrapping around a bit toward the back   . Quality: cramping, sharp  . Duration: 3 days . Timing: constant. . Modifying factors: Worse w/ touch  . Assoc signs/symptoms: NO fever, NO N/V, no D/C . LMP 09/03/19  Patient is accompanied by husband who assists with history-taking. My assistant, Jola Babinski CMA, also obtained some of the above history with the patient when she took her to the restroom to provide urine specimen. Jeri Lager also explained the importance of Pap testing, she is due..    At today's visit 09/26/19 ... PMH, PSH, FH reviewed and updated as needed.  Current medication list and allergy/intolerance hx reviewed and updated as needed. (See remainder of HPI, ROS, Phys Exam below)   No results found.  Results for orders placed or performed in visit on 09/26/19 (from the past 72 hour(s))  POCT urine pregnancy     Status: None   Collection Time: 09/26/19  2:53 PM  Result Value Ref Range   Preg Test, Ur Negative Negative  POCT Urinalysis Dipstick     Status: None   Collection Time: 09/26/19  2:53 PM  Result Value Ref Range   Color, UA Yellow    Clarity, UA Clear    Glucose, UA Negative Negative   Bilirubin, UA Negative    Ketones, UA Negative    Spec Grav, UA 1.025 1.010 - 1.025   Blood, UA Negative    pH, UA 7.0 5.0 - 8.0   Protein, UA Negative Negative   Urobilinogen, UA 0.2 0.2 or 1.0 E.U./dL   Nitrite, UA Negative    Leukocytes, UA Negative Negative   Appearance     Odor            ASSESSMENT/PLAN:  The primary encounter diagnosis was Right lower quadrant abdominal pain. A diagnosis of Pelvic pain was also pertinent to this visit.  Nonspecific pain, seems far too high in the abdomen to be ovarian/uterine, no changes in stool, seems a bit low to be gallbladder but would be possible, it is a bit high up for appendix but I think this is the main thing that we have to rule out.    Orders Placed This Encounter  Procedures  . CT Abdomen Pelvis W Contrast  . POCT urine pregnancy  . POCT Urinalysis Dipstick     No orders of the defined types were placed in this encounter.   Patient Instructions  Estoy preocupada por el apndice. Vamos a hacer una tomografa computarizada del abdomen para buscar cul es el problema, si es el apndice u [place]. Si su dolor empeora (especialmente si tiene fiebre, nuseas o vmitos), vaya al hospital!  (I am worried about the appendix.  We are going to get a CT scan of the abdomen to see if the appendix is the problem or something else.  If you pain worsens, especially if you have fever nausea or vomiting, please go to the hospital!)   Follow-up plan: Return for RECHECK PAIN PENDING CT RESULTS / IF WORSE OR CHANGE.                                                 ################################################# ################################################# ################################################# #################################################  Current Meds  Medication Sig  . cetirizine (ZYRTEC) 10 MG chewable tablet Chew 1 tablet (10 mg total) by mouth daily.  . famotidine (PEPCID) 40 MG tablet Take 1 tablet (40 mg total) by mouth at bedtime.  . ferrous sulfate 325 (65 FE) MG EC tablet Take 1 tablet (325 mg total) by mouth 2 (two) times daily with a meal.  . ibuprofen (ADVIL) 800 MG tablet Take 1 tablet (800 mg total) by mouth every 8 (eight) hours as needed for moderate pain or  cramping.  . triamcinolone cream (KENALOG) 0.1 % Apply 1 application topically 2 (two) times daily.    No Known Allergies     Review of Systems:  Constitutional: No recent illness  HEENT: No  headache, no vision change  Cardiac: No  chest pain, No  pressure, No palpitations  Respiratory:  No  shortness of breath. No  Cough  Gastrointestinal: No  abdominal pain, no change on bowel habits  Musculoskeletal: No new myalgia/arthralgia  Skin: No  Rash  Hem/Onc: No  easy bruising/bleeding, No  abnormal lumps/bumps  Neurologic: No  weakness, No  Dizziness  Psychiatric: No  concerns with depression, No  concerns with anxiety  Exam:  BP 126/78 (BP Location: Left Arm, Patient Position: Sitting, Cuff Size: Normal)   Pulse 78   Temp 98.5 F (36.9 C) (Oral)   Wt 198 lb 1.3 oz (89.8 kg)   LMP 09/03/2019   BMI 37.43 kg/m   Constitutional: VS see above. General Appearance: alert, well-developed, well-nourished, NAD  Eyes: Normal lids and conjunctive, non-icteric sclera  Ears, Nose, Mouth, Throat: MMM, Normal external inspection ears/nares/mouth/lips/gums.  Neck: No masses, trachea midline.   Respiratory: Normal respiratory effort. no wheeze, no rhonchi, no rales  Cardiovascular: S1/S2 normal, no murmur, no rub/gallop auscultated. RRR.   Musculoskeletal: Gait normal. Symmetric and independent movement of all extremities  Abdominal: +tender R abdomen lateral to umbilicus, non-distended, no appreciable organomegaly, neg Murphy's, BS [id number]  Neurological: Normal balance/coordination. No tremor.  Skin: warm, dry, intact.   Psychiatric: Normal judgment/insight. Normal mood and affect. Oriented x3.       Visit summary with medication list and pertinent instructions was printed for patient to review, patient was advised to alert [place] if any updates are needed. All questions at time of visit were answered - patient instructed to contact office with any additional concerns. ER/RTC  precautions were reviewed with the patient and understanding verbalized.   Note: Total time spent 25 minutes, greater than 50% of the visit was spent face-to-face counseling and coordinating care for the following: The primary encounter diagnosis was Right lower quadrant abdominal pain. A diagnosis of Pelvic pain was also pertinent to this visit.Marland Kitchen  Please note: voice recognition software was used to produce this document, and typos may escape review. Please contact Dr. Sheppard Coil for any needed clarifications.    Follow up plan: Return for RECHECK PAIN PENDING CT RESULTS / IF WORSE OR CHANGE.

## 2019-09-27 ENCOUNTER — Ambulatory Visit (INDEPENDENT_AMBULATORY_CARE_PROVIDER_SITE_OTHER): Payer: 59

## 2019-09-27 DIAGNOSIS — R932 Abnormal findings on diagnostic imaging of liver and biliary tract: Secondary | ICD-10-CM | POA: Diagnosis not present

## 2019-09-27 DIAGNOSIS — R1031 Right lower quadrant pain: Secondary | ICD-10-CM | POA: Diagnosis not present

## 2019-09-27 DIAGNOSIS — K8020 Calculus of gallbladder without cholecystitis without obstruction: Secondary | ICD-10-CM

## 2019-09-27 MED ORDER — IOHEXOL 300 MG/ML  SOLN
100.0000 mL | Freq: Once | INTRAMUSCULAR | Status: AC | PRN
  Administered 2019-09-27: 09:00:00 100 mL via INTRAVENOUS

## 2019-09-27 NOTE — Addendum Note (Signed)
Addended by: Maryla Morrow on: 09/27/2019 10:28 AM   Modules accepted: Orders

## 2020-01-23 ENCOUNTER — Ambulatory Visit (INDEPENDENT_AMBULATORY_CARE_PROVIDER_SITE_OTHER): Payer: 59 | Admitting: Osteopathic Medicine

## 2020-01-23 ENCOUNTER — Encounter: Payer: Self-pay | Admitting: Osteopathic Medicine

## 2020-01-23 ENCOUNTER — Other Ambulatory Visit: Payer: Self-pay

## 2020-01-23 VITALS — BP 132/80 | HR 84 | Temp 98.1°F | Wt 196.1 lb

## 2020-01-23 DIAGNOSIS — R1013 Epigastric pain: Secondary | ICD-10-CM | POA: Diagnosis not present

## 2020-01-23 DIAGNOSIS — R109 Unspecified abdominal pain: Secondary | ICD-10-CM | POA: Diagnosis not present

## 2020-01-23 LAB — POCT URINE PREGNANCY: Preg Test, Ur: NEGATIVE

## 2020-01-23 MED ORDER — PANTOPRAZOLE SODIUM 40 MG PO TBEC: 40.0000 mg | DELAYED_RELEASE_TABLET | Freq: Every day | ORAL | 0 refills | Status: DC

## 2020-01-23 NOTE — Progress Notes (Signed)
Martha Williams is a 29 y.o. female who presents to  [place] [facility]  today, 01/23/20, seeking care for the following: . Abdominal pain     ASSESSMENT & PLAN with other pertinent history/findings:  The primary encounter diagnosis was Epigastric pain. A diagnosis of Abdominal pain, unspecified abdominal location was also pertinent to this visit. Suspect gastritis/ulceration  Today 01/23/20: here for abdominal pain and requests medications  no pain at this time, upper abdomen, comes and goes, worse with eating and consistent w/ all meals over the past 5 days or so. No N/V/D+/C-. No heartburn, LMP 12/21/19, UPT (-) today, refused pelvic exam, is sexually active  Plan: H Pylori test, PPI, consider GI referral based on improvement/wrose/persist    Relevant background:   Last visit 09/26/2019 for abdominal pain   x3 days, constant discomfort in middle R side wrapping around to the back, worse w/ touching, no fever, no NV, no diarrhea, no constipation. I had of course some concerns for evolving appendicitis,   CT was normal for appendix, (+)gallstones, [place] (+)stones w/o obcious cholecystitis and eng Murphy's, (+)hepatic steatosis.   Referred to general surgery.   Pain today feels different.      Orders Placed This Encounter  Procedures  . H. pylori breath test  . POCT urine pregnancy    Meds ordered this encounter  Medications  . pantoprazole (PROTONIX) 40 MG tablet    Sig: Take 1 tablet (40 mg total) by mouth daily.    Dispense:  90 tablet    Refill:  0       Follow-up instructions: Return if symptoms worsen or fail to improve.                                         BP 132/80 (BP Location: Left Arm, Patient Position: Sitting, Cuff Size: Normal)   Pulse 84   Temp 98.1 F (36.7 C) (Oral)   Wt 196 lb 1.9 oz (89 kg)   LMP 12/21/2019   BMI 37.06 kg/m   Current Meds   Medication Sig  . cetirizine (ZYRTEC) 10 MG chewable tablet Chew 1 tablet (10 mg total) by mouth daily.  . famotidine (PEPCID) 40 MG tablet Take 1 tablet (40 mg total) by mouth at bedtime.  . ferrous sulfate 325 (65 FE) MG EC tablet Take 1 tablet (325 mg total) by mouth 2 (two) times daily with a meal.  . ibuprofen (ADVIL) 800 MG tablet Take 1 tablet (800 mg total) by mouth every 8 (eight) hours as needed for moderate pain or cramping.  . SUMAtriptan (IMITREX) 50 MG tablet Take 1 tablet (50 mg total) by mouth every 2 (two) hours as needed for migraine. May repeat in 2 hours if headache persists or recurs.  . triamcinolone cream (KENALOG) 0.1 % Apply 1 application topically 2 (two) times daily.    Results for orders placed or performed in visit on 01/23/20 (from the past 72 hour(s))  POCT urine pregnancy     Status: None   Collection Time: 01/23/20  1:27 PM  Result Value Ref Range   Preg Test, Ur Negative Negative    No results found.  Depression screen [facility] 2/9 01/10/2018 12/08/2017  Decreased Interest 2 2  Down, Depressed, Hopeless 0 2  PHQ - 2 Score 2 4  Altered sleeping 1 0  Tired, decreased energy  2 1  Change in appetite 1 1  Feeling bad or failure about yourself  0 1  Trouble concentrating 0 0  Moving slowly or fidgety/restless 1 1  Suicidal thoughts 0 1  PHQ-9 Score 7 9    GAD 7 : Generalized Anxiety Score 12/08/2017  Nervous, Anxious, on Edge 2  Control/stop worrying 2  Worry too much - different things 2  Trouble relaxing 2  Restless 0  Easily annoyed or irritable 1  Afraid - awful might happen 1  Total GAD 7 Score 10      All questions at time of visit were answered - patient instructed to contact office with any additional concerns or updates.  ER/RTC precautions were reviewed with the patient.  Please note: voice recognition software was used to produce this document, and typos may escape review. Please contact Dr. Lyn Hollingshead for any needed clarifications.

## 2020-01-24 LAB — H. PYLORI BREATH TEST: H. pylori Breath Test: DETECTED — AB

## 2020-01-26 MED ORDER — TETRACYCLINE HCL 500 MG PO CAPS: 500.0000 mg | ORAL_CAPSULE | Freq: Four times a day (QID) | ORAL | 0 refills | Status: DC

## 2020-01-26 MED ORDER — METRONIDAZOLE 250 MG PO TABS: 250.0000 mg | ORAL_TABLET | Freq: Four times a day (QID) | ORAL | 0 refills | Status: DC

## 2020-01-26 MED ORDER — BISMUTH SUBSALICYLATE 525 MG/15ML PO SUSP: 525.0000 mg | Freq: Four times a day (QID) | ORAL | 0 refills | Status: AC

## 2020-01-26 NOTE — Addendum Note (Signed)
Addended by: Deirdre Pippins on: 01/26/2020 02:52 PM   Modules accepted: Orders

## 2020-05-22 ENCOUNTER — Other Ambulatory Visit: Payer: Self-pay

## 2020-05-22 ENCOUNTER — Encounter: Payer: Self-pay | Admitting: Nurse Practitioner

## 2020-05-22 ENCOUNTER — Ambulatory Visit (INDEPENDENT_AMBULATORY_CARE_PROVIDER_SITE_OTHER): Payer: 59 | Admitting: Nurse Practitioner

## 2020-05-22 VITALS — BP 126/78 | HR 74 | Temp 98.0°F | Ht 61.0 in | Wt 185.1 lb

## 2020-05-22 DIAGNOSIS — M25561 Pain in right knee: Secondary | ICD-10-CM

## 2020-05-22 DIAGNOSIS — M25541 Pain in joints of right hand: Secondary | ICD-10-CM | POA: Diagnosis not present

## 2020-05-22 DIAGNOSIS — R21 Rash and other nonspecific skin eruption: Secondary | ICD-10-CM | POA: Diagnosis not present

## 2020-05-22 DIAGNOSIS — M25562 Pain in left knee: Secondary | ICD-10-CM

## 2020-05-22 DIAGNOSIS — M25542 Pain in joints of left hand: Secondary | ICD-10-CM

## 2020-05-22 MED ORDER — PREDNISONE 50 MG PO TABS: 50.0000 mg | ORAL_TABLET | Freq: Every day | ORAL | 0 refills | Status: DC

## 2020-05-22 NOTE — Progress Notes (Signed)
Acute Office Visit  Subjective:    Patient ID: Martha Williams, female    DOB: Dec 01, 1990, 29 y.o.   MRN: [id number]  Chief Complaint  Patient presents with  . Knee Pain    bilateral knee pain, onset:2wks, pain with changing positions, has only tried exercises with no reliev  . Rash    bilateral arms, onset:4wks, spreading to back, palsm, fingers, and ears, sensitive in wrist area, red, raised, starts as an asymetrical circle, hsa tried home made remedies with no relief    HPI Patient is in today for rash and knee pain. She is accompanied by a professional interpreter today.   RASH She reports onset of intensely pruritic maculopapular rash that first started about a month ago. The first lesion appeared on her left forearm in a single location and has subsequently spread to her left lateral upper arm, her left ear, her right shoulder blade, her right lateral upper arm, right ear, and now is present on her hands bilaterally. She also reports that it feels like there are lesions in her left nostril. She reports the areas were not vesicular at onset, but have scabbed over after intense scratching. The original lesion is now macular and brown in color.   She denies fever, chills, nausea, vomiting, diarrhea, known contacts with plants or insects, new soap/detergent/lotion, or new sexual partners.   She has tried triamcinolone cream, which she was given for a similar rash to the back of her legs about two years ago, but reports this has not been effective at all.   KNEE PAIN She reports sudden onset of bilateral knee pain that started about two weeks ago. She reports the pain is felt when she has been sitting or in bed and is worse when she first starts to walk, but improves with exercise. She also reports that she has similar pain in her hands bilaterally and it is difficult to make a fist.   She denies any injuries or new exercise. She has never had anything like this happen in the past. She  reports the pain is about an 8/10 when first moving around. She has recently been working about 8-10 hours standing stationary on her feet. She denies joint pain in any other location.   She has tried stretching exercises to help, but the symptoms are not improving.   Past Medical History:  Diagnosis Date  . Depression   . Iron deficiency anemia 05/03/2019    History reviewed. No pertinent surgical history.  Family History  Problem Relation Age of Onset  . High blood pressure Mother   . Cancer Maternal Grandmother     Social History   Socioeconomic History  . Marital status: Legally Separated    Spouse name: Not on file  . Number of children: 0  . Years of education: Not on file  . Highest education level: Not on file  Occupational History  . Occupation: Fabrication  Tobacco Use  . Smoking status: Never Smoker  . Smokeless tobacco: Never Used  Vaping Use  . Vaping Use: Never used  Substance and Sexual Activity  . Alcohol use: Yes    Comment: socially  . Drug use: No  . Sexual activity: Yes    Partners: Male    Birth control/protection: None  Other Topics Concern  . Not on file  Social History Narrative  . Not on file   Social Determinants of Health   Financial Resource Strain:   . Difficulty of Paying Living Expenses:  Food Insecurity:   . Worried About Charity fundraiser in the Last Year:   . Arboriculturist in the Last Year:   Transportation Needs:   . Film/video editor (Medical):   Marland Kitchen Lack of Transportation (Non-Medical):   Physical Activity:   . Days of Exercise per Week:   . Minutes of Exercise per Session:   Stress:   . Feeling of Stress :   Social Connections:   . Frequency of Communication with Friends and Family:   . Frequency of Social Gatherings with Friends and Family:   . Attends Religious Services:   . Active Member of Clubs or Organizations:   . Attends Archivist Meetings:   Marland Kitchen Marital Status:   Intimate Partner  Violence:   . Fear of Current or Ex-Partner:   . Emotionally Abused:   Marland Kitchen Physically Abused:   . Sexually Abused:     Outpatient Medications Prior to Visit  Medication Sig Dispense Refill  . cetirizine (ZYRTEC) 10 MG chewable tablet Chew 1 tablet (10 mg total) by mouth daily. 30 tablet 1  . diazepam (VALIUM) 2 MG tablet Take 1-2 tablets (2-4 mg total) by mouth once as needed (1 hour prior to Pap / pelvic exam). 2 tablet 0  . famotidine (PEPCID) 40 MG tablet Take 1 tablet (40 mg total) by mouth at bedtime. 90 tablet 0  . ferrous sulfate 325 (65 FE) MG EC tablet Take 1 tablet (325 mg total) by mouth 2 (two) times daily with a meal. 180 tablet 1  . ibuprofen (ADVIL) 800 MG tablet Take 1 tablet (800 mg total) by mouth every 8 (eight) hours as needed for moderate pain or cramping. 15 tablet 0  . metroNIDAZOLE (FLAGYL) 250 MG tablet Take 1 tablet (250 mg total) by mouth 4 (four) times daily. 56 tablet 0  . pantoprazole (PROTONIX) 40 MG tablet Take 1 tablet (40 mg total) by mouth daily. 90 tablet 0  . SUMAtriptan (IMITREX) 50 MG tablet Take 1 tablet (50 mg total) by mouth every 2 (two) hours as needed for migraine. May repeat in 2 hours if headache persists or recurs. 9 tablet 0  . tetracycline (SUMYCIN) 500 MG capsule Take 1 capsule (500 mg total) by mouth 4 (four) times daily. 56 capsule 0  . triamcinolone cream (KENALOG) 0.1 % Apply 1 application topically 2 (two) times daily. 30 g 0   No facility-administered medications prior to visit.    No Known Allergies     Objective:    Physical Exam Vitals and nursing note reviewed.  Constitutional:      Appearance: Normal appearance. She is not diaphoretic.  HENT:     Head: Normocephalic.     Nose: Nose normal.     Mouth/Throat:     Mouth: Mucous membranes are moist.     Pharynx: Oropharynx is clear.  Eyes:     Extraocular Movements: Extraocular movements intact.     Conjunctiva/sclera: Conjunctivae normal.     Pupils: Pupils are equal,  round, and reactive to light.  Cardiovascular:     Rate and Rhythm: Normal rate and regular rhythm.     Pulses: Normal pulses.     Heart sounds: Normal heart sounds.  Pulmonary:     Effort: Pulmonary effort is normal.     Breath sounds: Normal breath sounds.  Abdominal:     General: Abdomen is flat. Bowel sounds are normal.     Palpations: Abdomen is soft.  Musculoskeletal:  General: Tenderness present. No swelling.     Right hand: Swelling and tenderness present. No bony tenderness. Normal range of motion. Normal strength. Normal sensation. Normal capillary refill. Normal pulse.     Left hand: Swelling and tenderness present. No bony tenderness. Normal range of motion. Normal strength. Normal sensation. Normal capillary refill. Normal pulse.     Cervical back: Normal range of motion. No rigidity or tenderness.     Right knee: No swelling, deformity, erythema or crepitus. Normal range of motion. Tenderness present over the medial joint line. No LCL laxity, MCL laxity, ACL laxity or PCL laxity. Normal patellar mobility.     Left knee: No swelling, deformity, erythema or crepitus. Normal range of motion. Tenderness present over the medial joint line. No LCL laxity, MCL laxity, ACL laxity or PCL laxity.Normal patellar mobility.     Right lower leg: No edema.     Left lower leg: No edema.       Legs:     Comments: Tenderness noted over the joints of the fingers on palpation. There is mild edema to the hands bilaterally and scattered macular rash on both hands bilaterally.     Lymphadenopathy:     Cervical: No cervical adenopathy.  Skin:    General: Skin is warm and dry.     Capillary Refill: Capillary refill takes less than 2 seconds.     Findings: Erythema, lesion and rash present. No bruising. Rash is macular and papular.          Comments: No drainage or crusting noted. Areas are in various stages of healing and some are covered in a scab from suspected scratching. Images have  been uploaded into the chart.   Neurological:     General: No focal deficit present.     Mental Status: She is alert and oriented to person, place, and time.     Motor: No weakness.     Coordination: Coordination normal.     Gait: Gait normal.     Deep Tendon Reflexes: Reflexes normal.  Psychiatric:        Mood and Affect: Mood normal.        Behavior: Behavior normal.        Thought Content: Thought content normal.        Judgment: Judgment normal.     BP 126/78   Pulse 74   Temp 98 F (36.7 C) (Oral)   Ht '5\' 1"'  (1.549 m)   Wt 185 lb 1.6 oz (84 kg)   SpO2 100%   BMI 34.97 kg/m  Wt Readings from Last 3 Encounters:  05/22/20 185 lb 1.6 oz (84 kg)  01/23/20 196 lb 1.9 oz (89 kg)  09/26/19 198 lb 1.3 oz (89.8 kg)    There are no preventive care reminders to display for this patient.  There are no preventive care reminders to display for this patient.   No results found for: TSH Lab Results  Component Value Date   WBC 6.2 05/01/2019   HGB 11.3 (L) 05/01/2019   HCT 36.1 05/01/2019   MCV 71.5 (L) 05/01/2019   PLT 413 (H) 05/01/2019   Lab Results  Component Value Date   NA 137 05/01/2019   K 3.9 05/01/2019   CO2 22 05/01/2019   GLUCOSE 86 05/01/2019   BUN 12 05/01/2019   CREATININE 0.97 05/01/2019   BILITOT 0.3 05/01/2019   AST 22 05/01/2019   ALT 30 (H) 05/01/2019   PROT 7.8 05/01/2019   CALCIUM 9.2  05/01/2019   Lab Results  Component Value Date   CHOL 138 05/01/2019   Lab Results  Component Value Date   HDL 42 (L) 05/01/2019   Lab Results  Component Value Date   LDLCALC 76 05/01/2019   Lab Results  Component Value Date   TRIG 123 05/01/2019   Lab Results  Component Value Date   CHOLHDL 3.3 05/01/2019   No results found for: HGBA1C     Assessment & Plan:   1. Rash Generalized pruritic rash in various stages of eruption and healing scattered on the upper portion of the body. The last area of involvement is reportedly the left  shoulder/scapular region, until then only present on the upper extremities and head. The origin or cause of the rash is unclear. Given that she is also experiencing a new onset of joint pain, we discussed the option to test for various conditions that could cause her symptoms. She found no relief with triamcinolone cream topically. We did discuss the option to perform a punch biopsy of one of the lesions, however, she is resistant to this idea at this time. Given the involvement with the palms of the hands, I am suspicious of possible syphilis infection. She is open to blood testing today. We will trial a 5 day burst of prednisone to see if this helps with symptoms. There are no signs of infection today, therefore will hold off on antibiotic therapy.    PLAN: - RPR - HIV antibody (with reflex) - B. burgdorfi antibodies - Ehrlichia Antibody Panel - Rocky mtn spotted fvr abs pnl(IgG+IgM) - CBC with Differential - COMPLETE METABOLIC PANEL WITH GFR - Sed Rate (ESR) - predniSONE (DELTASONE) 50 MG tablet; Take 1 tablet (50 mg total) by mouth daily.  Dispense: 5 tablet; Refill: 0 - Will notify patient of results of labs and make changes to plan of care as necessary.  - Follow-up in 2 weeks for further evaluation- if symptoms are still present, will readdress the option to perform biopsy of the area.   2. Arthralgia of both knees New onset of arthralgia when first moving around and getting up from a seated position in the setting of sudden onset of rash of unknown origin. It is likely that her pain and the rash are correlated. Given the lack of recent illness, fever, or sore throat, it is unlikely this is due to a streptococcal pharyngitis infection. We discussed the option to perform lab testing and she is agreeable. If her labs are unremarkable and symptoms continue, we may need to consider rheumatologic testing. We will plan to obtain labs today and treat with a 5 day prednisone burst to see if her  symptoms improve.   PLAN:  - RPR - HIV antibody (with reflex) - B. burgdorfi antibodies - Ehrlichia Antibody Panel - Rocky mtn spotted fvr abs pnl(IgG+IgM) - CBC with Differential - COMPLETE METABOLIC PANEL WITH GFR - Sed Rate (ESR) - predniSONE (DELTASONE) 50 MG tablet; Take 1 tablet (50 mg total) by mouth daily.  Dispense: 5 tablet; Refill: 0 - If new symptoms develop, please notify the office.  - Follow-up in 2 weeks for re-evaluation. - I will contact you with lab results and we can make changes to the plan of care based on the results, if needed.   3. Arthralgia of both hands New onset of arthralgia and mild edema to the hands bilaterally in the setting of sudden onset of rash of unknown origin. It is likely that her pain and  the rash are correlated. There is no joint deformity or limitation to motion detected on examination. Given the lack of recent illness, fever, or sore throat, it is unlikely this is due to a streptococcal pharyngitis infection. We discussed the option to perform lab testing and she is agreeable. If her labs are unremarkable and symptoms continue, we may need to consider rheumatologic testing. We will plan to obtain labs today and treat with a 5 day prednisone burst to see if her symptoms improve.   PLAN: - RPR - HIV antibody (with reflex) - B. burgdorfi antibodies - Ehrlichia Antibody Panel - Rocky mtn spotted fvr abs pnl(IgG+IgM) - CBC with Differential - COMPLETE METABOLIC PANEL WITH GFR - Sed Rate (ESR) - predniSONE (DELTASONE) 50 MG tablet; Take 1 tablet (50 mg total) by mouth daily.  Dispense: 5 tablet; Refill: 0 - Follow-up in 2 weeks for re-evaluation of symptoms - If new symptoms develop, please contact the office - I will contact you with the results of the lab work and we can make changes to the plan of care based on the results, if needed.   Return in about 2 weeks (around 06/05/2020) for rash-joint pain.   Orma Render, NP

## 2020-05-22 NOTE — Patient Instructions (Addendum)
We will call you with your lab results and let you know if it shows anything.   Follow-up in 2 weeks with me to see how things are doing.   If this gets worse, please let us know.    Erupcin cutnea en los adultos Rash, Adult  Una erupcin es un cambio en el color de la piel. Una erupcin tambin puede cambiar la forma en que se siente la piel. Hay muchas afecciones y [employer] que pueden causar una erupcin. Siga estas indicaciones en su casa: El objetivo del tratamiento es calmar la picazn y evitar que la erupcin se propague. Controle si hay algn cambio en sus sntomas. Informe a su mdico acerca de los cambios. Estas indicaciones pueden ayudarlo con la afeccin: Medicamentos [place] o aplique los medicamentos de venta libre y los recetados solamente como se lo haya indicado el mdico. Esto puede incluir medicamentos:  Para tratar la piel enrojecida o hinchada (cremas con corticoesteroides).  Para tratar la picazn.  Para tratar Vella Raring (antihistamnicos por va oral).  Para tratar sntomas muy graves (corticoesteroides por va oral).  Cuidado de la piel  Coloque paos fros (compresas) en las zonas afectadas.  No se rasque ni se refriegue la piel.  Evite cubrir la erupcin. Asegrese de que la erupcin est expuesta al aire todo lo posible. Control de la picazn y las [employer] baos o las duchas calientes. Estos pueden empeorar la picazn. Neomia Dear ducha fra puede Acupuncturist.  Trate de tomar un bao con lo siguiente: ? Sales de [place]. Puede conseguirlas en la tienda de comestibles o la farmacia local. Siga las indicaciones del envase. ? Bicarbonato de sodio. Vierta un poco en la baera como se lo haya indicado el mdico. ? Avena coloidal. Puede conseguirla en la tienda de comestibles o la farmacia local. Siga las indicaciones del envase.  Intente colocarse una pasta de bicarbonato de [employer]. Agregue agua al bicarbonato de sodio  hasta que se forme una pasta.  Intente aplicarse una locin que [employer] picazn (locin de calamina).  Mantngase fresco y al resguardo del sol. La transpiracin y el calor pueden empeorar la picazn. Indicaciones generales   Descanse todo lo que sea necesario.  Beba suficiente lquido para Radio producer pis (la orina) de color amarillo plido.  Use ropa holgada.  Evite los detergentes y los jabones perfumados, y los perfumes. Utilice jabones, detergentes, perfumes y cosmticos suaves.  Evite todo lo que le cause erupcin cutnea. Lleve un diario como ayuda para registrar lo que le causa erupcin. Escriba los siguientes datos: ? Lo que come. ? Los cosmticos que [place]. ? Lo que bebe. ? La ropa que [place]. Esto incluye las alhajas.  Concurra a todas las visitas de [address] se lo haya indicado el mdico. Esto es importante. Comunquese con un mdico si:  Transpira de noche.  Pierde peso.  Orina ms de lo normal.  Orina menos de lo normal u observa que la orina es de color ms oscuro que lo normal.  Se siente dbil.  Vomita.  Tiene un color amarillo en la piel o en las partes blancas del ojo (ictericia).  La piel: ? Hormiguea. ? Se adormece.  La erupcin cutnea: ? No desaparece despus de [employer]. ? Empeora.  Usted: ? Est ms sediento que lo habitual. ? Est ms cansado que lo habitual.  Tiene los siguientes sntomas: ? Sntomas nuevos. ? Dolor en el vientre (abdomen). ? Fiebre. ? Materia  fecal lquida (diarrea). Solicite ayuda inmediatamente si:  Lance Muss, y los sntomas empeoran repentinamente.  Empieza a sentirse desorientado (confundido).  Siente un dolor de cabeza intenso o tiene rigidez en el cuello.  Siente mucho dolor o rigidez en las articulaciones.  Tiene una crisis de movimientos que no puede controlar (convulsiones).  La erupcin cubre todo el cuerpo o la mayor parte de [place]. La erupcin puede o no ser dolorosa.  Tiene  ampollas con las siguientes caractersticas: ? Se encuentran arriba de la erupcin. ? Se agrandan. ? Crecen juntas. ? Son dolorosas. ? Estn dentro de la nariz o la boca.  Tiene una erupcin cutnea con estas caractersticas: ? Tiene pequeas manchas moradas, como si fueran pinchazos, en todo el cuerpo. ? Tiene un "ojo de buey" o se parece a un blanco de tiro. ? Est enrojecida y le duele, le produce descamacin de la piel y no se relaciona con haber estado mucho tiempo bajo el sol. Resumen  Una erupcin es un cambio en el color de la piel. Una erupcin tambin puede cambiar la forma en que se siente la piel.  El objetivo del tratamiento es calmar la picazn y evitar que la erupcin se propague.  Tome o [employer] medicamentos de venta libre y los recetados solamente como se lo haya indicado el mdico.  Comunquese con un mdico de inmediato si tiene sntomas nuevos o si sus sntomas empeoran.  Concurra a todas las visitas de [address] se lo haya indicado el mdico. Esto es importante. Esta informacin no tiene Theme park manager el consejo del mdico. Asegrese de hacerle al mdico cualquier pregunta que tenga. Document Revised: 07/06/2018 Document Reviewed: 07/06/2018 Elsevier Patient Education  2020 [employer].

## 2020-05-27 LAB — CBC WITH DIFFERENTIAL/PLATELET
Absolute Monocytes: 237 cells/uL (ref 200–950)
Basophils Absolute: 21 cells/uL (ref 0–200)
Basophils Relative: 0.7 %
Eosinophils Absolute: 267 cells/uL (ref 15–500)
Eosinophils Relative: 8.9 %
HCT: 39.9 % (ref 35.0–45.0)
Hemoglobin: 12.8 g/dL (ref 11.7–15.5)
Lymphs Abs: 1152 cells/uL (ref 850–3900)
MCH: 24.2 pg — ABNORMAL LOW (ref 27.0–33.0)
MCHC: 32.1 g/dL (ref 32.0–36.0)
MCV: 75.6 fL — ABNORMAL LOW (ref 80.0–100.0)
MPV: 10.5 fL (ref 7.5–12.5)
Monocytes Relative: 7.9 %
Neutro Abs: 1323 cells/uL — ABNORMAL LOW (ref 1500–7800)
Neutrophils Relative %: 44.1 %
Platelets: 263 10*3/uL (ref 140–400)
RBC: 5.28 10*6/uL — ABNORMAL HIGH (ref 3.80–5.10)
RDW: 17.1 % — ABNORMAL HIGH (ref 11.0–15.0)
Total Lymphocyte: 38.4 %
WBC: 3 10*3/uL — ABNORMAL LOW (ref 3.8–10.8)

## 2020-05-27 LAB — COMPLETE METABOLIC PANEL WITH GFR
AG Ratio: 1 (calc) (ref 1.0–2.5)
ALT: 121 U/L — ABNORMAL HIGH (ref 6–29)
AST: 102 U/L — ABNORMAL HIGH (ref 10–30)
Albumin: 4.4 g/dL (ref 3.6–5.1)
Alkaline phosphatase (APISO): 87 U/L (ref 31–125)
BUN: 10 mg/dL (ref 7–25)
CO2: 24 mmol/L (ref 20–32)
Calcium: 9.1 mg/dL (ref 8.6–10.2)
Chloride: 105 mmol/L (ref 98–110)
Creat: 0.59 mg/dL (ref 0.50–1.10)
GFR, Est African American: 146 mL/min/{1.73_m2} (ref >=60)
GFR, Est Non African American: 126 mL/min/{1.73_m2} (ref >=60)
Globulin: 4.3 g/dL (calc) — ABNORMAL HIGH (ref 1.9–3.7)
Glucose, Bld: 101 mg/dL — ABNORMAL HIGH (ref 65–99)
Potassium: 4.3 mmol/L (ref 3.5–5.3)
Sodium: 137 mmol/L (ref 135–146)
Total Bilirubin: 0.5 mg/dL (ref 0.2–1.2)
Total Protein: 8.7 g/dL — ABNORMAL HIGH (ref 6.1–8.1)

## 2020-05-27 LAB — B. BURGDORFI ANTIBODIES: B burgdorferi Ab IgG+IgM: <0.9 index

## 2020-05-27 LAB — EHRLICHIA ANTIBODY PANEL
E. CHAFFEENSIS AB IGG: <1:64 {titer}
E. CHAFFEENSIS AB IGM: <1:20 {titer}

## 2020-05-27 LAB — SEDIMENTATION RATE

## 2020-05-27 LAB — ROCKY MTN SPOTTED FVR ABS PNL(IGG+IGM)
RMSF IgG: NOT DETECTED
RMSF IgM: NOT DETECTED

## 2020-05-27 LAB — PATHOLOGIST SMEAR REVIEW

## 2020-05-27 LAB — HIV ANTIBODY (ROUTINE TESTING W REFLEX): HIV 1&2 Ab, 4th Generation: NONREACTIVE

## 2020-05-27 LAB — SED RATE MANUAL WEST RFLX: SED RATE BY MODIFIED WESTERGREN,MANUAL: 40 mm/h — ABNORMAL HIGH (ref 0–20)

## 2020-05-27 LAB — RPR: RPR Ser Ql: NONREACTIVE

## 2020-05-27 NOTE — Progress Notes (Signed)
Patient is only spanish speaking- Martha Williams, if you don't mind to call her with results, this may help significantly.  The tests for tick borne illnesses, HIV, and Syphilis were all negative.   Your complete blood count does show that your white blood cell counts, which are what fight illness, are low. They also show that your liver enzymes are elevated and the test which shows inflammation in the body was also elevated.   It doesn't quite give [place] an answer about what is causing the rash and joint pain, but does give [place] some ideas of things to look at. I have added a test that will look at the white blood cells closer to determine if there are changes to the cells that could indicate something wrong in your bone marrow or other illnesses.   Has the steroid helped at all? Are you experiencing any new symptoms? Any fevers or weight loss recently?  I will let you know when I have the other labs results in.

## 2020-05-29 ENCOUNTER — Other Ambulatory Visit: Payer: Self-pay

## 2020-05-29 ENCOUNTER — Other Ambulatory Visit: Payer: Self-pay | Admitting: Nurse Practitioner

## 2020-05-29 DIAGNOSIS — M25542 Pain in joints of left hand: Secondary | ICD-10-CM

## 2020-05-29 DIAGNOSIS — R21 Rash and other nonspecific skin eruption: Secondary | ICD-10-CM

## 2020-05-29 DIAGNOSIS — R748 Abnormal levels of other serum enzymes: Secondary | ICD-10-CM

## 2020-05-29 DIAGNOSIS — R109 Unspecified abdominal pain: Secondary | ICD-10-CM

## 2020-05-29 NOTE — Progress Notes (Signed)
Order placed for Hepatitis B and Hepatitis C testing.

## 2020-05-29 NOTE — Progress Notes (Signed)
At provider's request Martha Williams) - Hepatitis Acute panel (A,B,C) ordered.

## 2020-06-02 ENCOUNTER — Other Ambulatory Visit: Payer: Self-pay | Admitting: Nurse Practitioner

## 2020-06-03 LAB — HEPATITIS C AB W/RFL RNA, PCR + GENO
Hepatitis C Ab: NONREACTIVE
SIGNAL TO CUT-OFF: 0.05 (ref <1.00)

## 2020-06-03 LAB — HEPATITIS B SURFACE ANTIGEN: Hepatitis B Surface Ag: NONREACTIVE

## 2020-06-05 ENCOUNTER — Encounter: Payer: Self-pay | Admitting: Nurse Practitioner

## 2020-06-05 ENCOUNTER — Ambulatory Visit (INDEPENDENT_AMBULATORY_CARE_PROVIDER_SITE_OTHER): Payer: 59 | Admitting: Nurse Practitioner

## 2020-06-05 VITALS — BP 129/79 | HR 76 | Temp 98.0°F | Ht 61.0 in | Wt 186.7 lb

## 2020-06-05 DIAGNOSIS — R21 Rash and other nonspecific skin eruption: Secondary | ICD-10-CM | POA: Diagnosis not present

## 2020-06-05 DIAGNOSIS — R748 Abnormal levels of other serum enzymes: Secondary | ICD-10-CM

## 2020-06-05 DIAGNOSIS — M2550 Pain in unspecified joint: Secondary | ICD-10-CM

## 2020-06-05 MED ORDER — PREDNISONE 20 MG PO TABS: 20.0000 mg | ORAL_TABLET | Freq: Every day | ORAL | 1 refills | Status: AC

## 2020-06-05 NOTE — Progress Notes (Signed)
Acute Office Visit  Subjective:    Patient ID: Martha Williams, female    DOB: October 17, 1991, 29 y.o.   MRN: [id number]  Chief Complaint  Patient presents with  . Rash    2 week follow up, progressively getting better    HPI Patient is in today for follow-up for rash, joint pain, and abnormal labs from recent visit.   She was first seen two weeks ago with urticarial, maculopapular rash on her upper extremities, ears, hands, and trunk.. The rash was intensely pruritic and ranged from erythematous to dark brown in coloration- with darker coloration on lesions that were healing. She also reported joint pain in her knees and hands.   Lab work was negative for syphilis, HIV, tick borne diseases, hepatitis B, and hepatitis C.  Lab work did reveal elevated ESR, elevated globulin, elevated total protein, elevated ALT and AST, RBC, and RDW.  Lab work revealed decreased MCV, MCH, and WBC. A smear was ordered, but cancelled by the lab for unknown reasons.  She was placed on a 12m per day prednisone burst at the initial visit.   Today she reports the rash has improved, she does have residual, healing maculopapular lesions of dark brown color where the original rash presented. She also reports new vesicular lesions to the left second and third finger tips, which are painful. She reports the prednisone helped her symptoms significantly, including the joint pain.   Today she endorses continued joint pain in her bilateral knees, shoulders, and hands. She also endorses decreased appetite, with weight loss, and intermittent nausea.   She denies changes to bowel or bladder function, night sweats, fevers, chills, yellowing of the eyes or skin, abdominal pain or bloating.   She denies any alcohol use, any recreational drug use, use of acetaminophen or acetaminophen containing products. She denies any known family history of hepatitis or liver disease.   Past Medical History:  Diagnosis Date  . Depression    . Iron deficiency anemia 05/03/2019    History reviewed. No pertinent surgical history.  Family History  Problem Relation Age of Onset  . High blood pressure Mother   . Cancer Maternal Grandmother     Social History   Socioeconomic History  . Marital status: Legally Separated    Spouse name: Not on file  . Number of children: 0  . Years of education: Not on file  . Highest education level: Not on file  Occupational History  . Occupation: Fabrication  Tobacco Use  . Smoking status: Never Smoker  . Smokeless tobacco: Never Used  Vaping Use  . Vaping Use: Never used  Substance and Sexual Activity  . Alcohol use: Yes    Comment: socially  . Drug use: No  . Sexual activity: Yes    Partners: Male    Birth control/protection: None  Other Topics Concern  . Not on file  Social History Narrative  . Not on file   Social Determinants of Health   Financial Resource Strain:   . Difficulty of Paying Living Expenses:   Food Insecurity:   . Worried About RCharity fundraiserin the Last Year:   . RArboriculturistin the Last Year:   Transportation Needs:   . LFilm/video editor(Medical):   .Marland KitchenLack of Transportation (Non-Medical):   Physical Activity:   . Days of Exercise per Week:   . Minutes of Exercise per Session:   Stress:   . Feeling of Stress :  Social Connections:   . Frequency of Communication with Friends and Family:   . Frequency of Social Gatherings with Friends and Family:   . Attends Religious Services:   . Active Member of Clubs or Organizations:   . Attends Archivist Meetings:   Marland Kitchen Marital Status:   Intimate Partner Violence:   . Fear of Current or Ex-Partner:   . Emotionally Abused:   Marland Kitchen Physically Abused:   . Sexually Abused:     Outpatient Medications Prior to Visit  Medication Sig Dispense Refill  . cetirizine (ZYRTEC) 10 MG chewable tablet Chew 1 tablet (10 mg total) by mouth daily. 30 tablet 1  . diazepam (VALIUM) 2 MG tablet Take  1-2 tablets (2-4 mg total) by mouth once as needed (1 hour prior to Pap / pelvic exam). 2 tablet 0  . famotidine (PEPCID) 40 MG tablet Take 1 tablet (40 mg total) by mouth at bedtime. 90 tablet 0  . ferrous sulfate 325 (65 FE) MG EC tablet Take 1 tablet (325 mg total) by mouth 2 (two) times daily with a meal. 180 tablet 1  . ibuprofen (ADVIL) 800 MG tablet Take 1 tablet (800 mg total) by mouth every 8 (eight) hours as needed for moderate pain or cramping. 15 tablet 0  . metroNIDAZOLE (FLAGYL) 250 MG tablet Take 1 tablet (250 mg total) by mouth 4 (four) times daily. 56 tablet 0  . pantoprazole (PROTONIX) 40 MG tablet Take 1 tablet (40 mg total) by mouth daily. 90 tablet 0  . predniSONE (DELTASONE) 50 MG tablet Take 1 tablet (50 mg total) by mouth daily. 5 tablet 0  . SUMAtriptan (IMITREX) 50 MG tablet Take 1 tablet (50 mg total) by mouth every 2 (two) hours as needed for migraine. May repeat in 2 hours if headache persists or recurs. 9 tablet 0  . tetracycline (SUMYCIN) 500 MG capsule Take 1 capsule (500 mg total) by mouth 4 (four) times daily. 56 capsule 0  . triamcinolone cream (KENALOG) 0.1 % Apply 1 application topically 2 (two) times daily. 30 g 0   No facility-administered medications prior to visit.    No Known Allergies     Objective:    Physical Exam Vitals and nursing note reviewed.  Constitutional:      Appearance: Normal appearance. She is normal weight.  HENT:     Head: Normocephalic.     Mouth/Throat:     Mouth: Mucous membranes are moist.     Pharynx: Oropharynx is clear.  Eyes:     Extraocular Movements: Extraocular movements intact.     Conjunctiva/sclera: Conjunctivae normal.     Pupils: Pupils are equal, round, and reactive to light.  Cardiovascular:     Rate and Rhythm: Normal rate and regular rhythm.     Pulses: Normal pulses.     Heart sounds: Normal heart sounds.  Pulmonary:     Effort: Pulmonary effort is normal.     Breath sounds: Normal breath sounds.    Abdominal:     General: Abdomen is flat. Bowel sounds are normal. There is no distension or abdominal bruit.     Palpations: Abdomen is soft. There is hepatomegaly. There is no splenomegaly or mass.     Tenderness: There is abdominal tenderness in the right upper quadrant. There is guarding. There is no right CVA tenderness, left CVA tenderness or rebound.    Musculoskeletal:        General: Normal range of motion.     Cervical back:  Normal range of motion. No rigidity or tenderness.     Right lower leg: No edema.     Left lower leg: No edema.  Skin:    General: Skin is warm and dry.     Capillary Refill: Capillary refill takes less than 2 seconds.     Findings: Rash present.     Comments: Healing rash present on left scapular region, bilateral upper extremities, and bilateral ears.   Intact vesicular lesion present on the dip of the second and third fingers of the right hand.   Neurological:     General: No focal deficit present.     Mental Status: She is alert and oriented to person, place, and time.  Psychiatric:        Mood and Affect: Mood normal.        Behavior: Behavior normal.        Thought Content: Thought content normal.        Judgment: Judgment normal.     BP 129/79   Pulse 76   Temp 98 F (36.7 C) (Oral)   Ht '5\' 1"'  (1.549 m)   Wt 186 lb 11.2 oz (84.7 kg)   LMP 05/14/2020   SpO2 100%   BMI 35.28 kg/m  Wt Readings from Last 3 Encounters:  06/05/20 186 lb 11.2 oz (84.7 kg)  05/22/20 185 lb 1.6 oz (84 kg)  01/23/20 196 lb 1.9 oz (89 kg)    There are no preventive care reminders to display for this patient.  There are no preventive care reminders to display for this patient.   No results found for: TSH Lab Results  Component Value Date   WBC 3.0 (L) 05/22/2020   HGB 12.8 05/22/2020   HCT 39.9 05/22/2020   MCV 75.6 (L) 05/22/2020   PLT 263 05/22/2020   Lab Results  Component Value Date   NA 137 05/22/2020   K 4.3 05/22/2020   CO2 24 05/22/2020    GLUCOSE 101 (H) 05/22/2020   BUN 10 05/22/2020   CREATININE 0.59 05/22/2020   BILITOT 0.5 05/22/2020   AST 102 (H) 05/22/2020   ALT 121 (H) 05/22/2020   PROT 8.7 (H) 05/22/2020   CALCIUM 9.1 05/22/2020   Lab Results  Component Value Date   CHOL 138 05/01/2019   Lab Results  Component Value Date   HDL 42 (L) 05/01/2019   Lab Results  Component Value Date   LDLCALC 76 05/01/2019   Lab Results  Component Value Date   TRIG 123 05/01/2019   Lab Results  Component Value Date   CHOLHDL 3.3 05/01/2019   No results found for: HGBA1C     Assessment & Plan:   1. Elevated liver enzymes RUQ tenderness with nausea and weight loss in the setting of elevated liver enzymes and ESR. Strongly suspicious of hepatic disease. Hepatitis B and C labs non-reactive and given that she does not drink alcohol or use acetaminophen, I am doubtful that this is related to hepatic injury from either of these. I am concerned for autoimmune hepatitis, steatohepatitis, or possible gallbladder disorder. I discussed the concerns I have with her today and answered questions, as well as provided her with information in Spanish on hepatitis.  We will obtain an ultrasound of the liver and will send a referral to GI for further evaluation. Since the prednisone was helpful for her symptoms and she has not experience resolution of the rash and joint pain, I will send in an additional 2 weeks course  of prednisone 28m per day.   PLAN: - RUQ ultrasound - Ambulatory referral to Gastroenterology  2. Arthralgia, unspecified joint Multi-joint arthralgia in the setting of maculopapular rash and abnormal lab results. Suspicious of hepatic disorder. Will treat with 14 day course of 228mprednisone since this was helpful for her original symptoms. Will also obtain RUQ ultrasound and refer to GI for further evaluation.   - predniSONE (DELTASONE) 20 MG tablet; Take 1 tablet (20 mg total) by mouth daily with breakfast for 14  days.  Dispense: 14 tablet; Refill: 1  3. Rash and nonspecific skin eruption Multi-joint arthralgia in the setting of maculopapular rash and abnormal lab results. Suspicious of hepatic disorder. Will treat with 14 day course of 2015mrednisone since this was helpful for her original symptoms. Will also obtain RUQ ultrasound and refer to GI for further evaluation.   - predniSONE (DELTASONE) 20 MG tablet; Take 1 tablet (20 mg total) by mouth daily with breakfast for 14 days.  Dispense: 14 tablet; Refill: 1  Will notify patient of results of ultrasound and follow-up as necessary.   SarOrma RenderP

## 2020-06-05 NOTE — Patient Instructions (Addendum)
quiero que te hagas una ecografa del hgado.  la ecografa nos ayudar a ver si hay algn problema en el hgado.  La informacin de este documento est sujeta a una condicin que usted [employer]. Necesitamos revisar el hgado para ver si est presente.  La oficina de ultrasonido lo llamar para Nurse, learning disability prueba. Te llamar tan pronto como tengamos resultados.  Tambin voy a Tour manager derivacin a gastroenterologa para Garment/textile technologist.  Hepatitis autoinmunitaria en adultos Autoimmune Hepatitis, Adult  La hepatitis autoinmunitaria es una enfermedad en la que el sistema del organismo encargado de combatir las enfermedades (sistema inmunitario) ataca al hgado y causa su inflamacin. Sin tratamiento, esta afeccin empeora. Puede durar aos y derivar en la formacin de cicatrices en el hgado (cirrosis), as como en insuficiencia heptica. Tambin aumenta el riesgo de Research officer, political party de hgado. Generalmente, cuanto antes se administre tratamiento para esta afeccin, mejores sern los [place]. La [employer] de las personas dejan de tener sntomas cuando reciben [place]. Cules son las causas? Se desconoce la causa de esta afeccin. Los factores genticos, el medio ambiente y las infecciones previas pueden tener cierta incidencia en la causa. Qu incrementa el riesgo? Es ms probable que tenga esta afeccin si:  Es mujer.  Tiene entre 15 y 32aos. Cules son los signos o los sntomas? Los sntomas de esta afeccin [employer] siguientes:  [place]. Este es el sntoma ms frecuente.  Color amarillento de la piel, las partes blancas de los ojos y las membranas mucosas (ictericia).  Picazn.  Erupcin cutnea.  Dolor en las articulaciones.  Molestias abdominales.  Nuseas.  Prdida del apetito. Cuando la enfermedad est avanzada puede causar los siguientes sntomas, entre otros:  Lquido en el abdomen (ascitis).  Confusin.  Ausencia del perodo menstrual (en  las mujeres). Cmo se diagnostica? Esta afeccin se diagnostica mediante:  Un examen fsico y antecedentes mdicos.  Anlisis de [place].  Biopsia de hgado. En [employer], se toma [place] de tejido del hgado con [place] y se la examina con un microscopio. Cmo se trata? Esta afeccin se trata con lo siguiente:  Medicamentos para evitar que el hgado sufra ms daos. Es posible que tenga que tomar estos medicamentos de por vida. Estos medicamentos pueden incluir: ? Corticoesteroides. Estos son medicamentos que tratan la inflamacin. ? Inmunosupresores. Estos son medicamentos que reducen la actividad del sistema inmunitario.  Trasplante de hgado. Le darn el hgado sano de un donante. Esto se realiza en [employer] graves. Siga estas indicaciones en su casa: Comida y bebida  Siga una dieta saludable y [place].  Limite el consumo de alimentos con alto contenido de grasa, azcar y sal (sodio). No agregue sal extra a sus alimentos.  Beba suficiente lquido como para Pharmacologist la orina de color amarillo plido. Esto previene la deshidratacin. Limite la ingesta de lquido solo si su mdico se lo indica.  Mantenga limpios los lugares donde prepara las comidas y donde come. Lvese las manos antes y despus de preparar las comidas. Indicaciones generales  No beba alcohol. Este puede causar ms dao heptico.  [employer] de venta libre y los recetados solamente como se lo haya indicado el mdico.  Est al da con todas las vacunas segn se lo haya indicado el mdico.  Concurra a todas las visitas de seguimiento como se lo haya indicado el mdico. Esto es importante. Comunquese con un mdico si:  Tiene fiebre.  Aparecen nuevos sntomas.  Sus sntomas empeoran.  Est cada vez ms  fatigado o dbil.  Tiene ictericia o la ictericia empeora.  Siente nuseas o vomita.  Presenta una erupcin cutnea. Solicite ayuda inmediatamente si:  No puede comer ni  beber.  Se siente confundido.  Se le hincha la piel, la garganta, la boca o la cara.  Tiene una convulsin.  Est muy somnoliento o tiene dificultad para despertarse.  Presenta moretones o sangra fcilmente.  Siente dolor en el abdomen.  Siente mareos.  Tiene problemas para caminar. Resumen  La hepatitis autoinmunitaria es una enfermedad en la que el sistema inmunitario ataca al hgado y causa su inflamacin.  Puede durar aos y derivar en cirrosis y insuficiencia heptica.  Se desconoce la causa de esta afeccin. Los factores genticos, el medio ambiente y las infecciones previas pueden tener cierta incidencia en la causa.  Los sntomas pueden incluir fatiga, color amarillo en la piel y los ojos (ictericia), erupcin, Engineer, mining en las articulaciones, molestias en el abdomen y prdida del apetito.  Solicite ayuda de inmediato si no puede comer y beber, se siente confundido, tiene convulsiones, sangra con facilidad o se siente mareado. Esta informacin no tiene Theme park manager el consejo del mdico. Asegrese de hacerle al mdico cualquier pregunta que tenga. Document Revised: 05/08/2018 Document Reviewed: 05/08/2018 Elsevier Patient Education  2020 [employer].

## 2020-06-05 NOTE — Progress Notes (Signed)
Patient is coming in for appointment today to discuss- no need to call her at this time.   Hepatitis labs were all normal. I am concerned about the elevated liver enzymes, but the good news is this does not appear to be from viral hepatitis. We will plan to get a repeat of the liver enzymes in the next 2-4 weeks and see if they are still elevated. If that is the case, then we may need to get an ultrasound of the liver to see if there is something going on.

## 2020-06-19 ENCOUNTER — Encounter: Payer: 59 | Admitting: Osteopathic Medicine

## 2020-06-23 ENCOUNTER — Encounter: Payer: Self-pay | Admitting: Nurse Practitioner

## 2020-06-23 ENCOUNTER — Ambulatory Visit (INDEPENDENT_AMBULATORY_CARE_PROVIDER_SITE_OTHER): Payer: 59 | Admitting: Nurse Practitioner

## 2020-06-23 VITALS — BP 110/75 | HR 67 | Temp 98.1°F | Ht 62.5 in | Wt 184.9 lb

## 2020-06-23 DIAGNOSIS — R238 Other skin changes: Secondary | ICD-10-CM | POA: Diagnosis not present

## 2020-06-23 DIAGNOSIS — Z0000 Encounter for general adult medical examination without abnormal findings: Secondary | ICD-10-CM

## 2020-06-23 DIAGNOSIS — K219 Gastro-esophageal reflux disease without esophagitis: Secondary | ICD-10-CM

## 2020-06-23 DIAGNOSIS — M25562 Pain in left knee: Secondary | ICD-10-CM

## 2020-06-23 DIAGNOSIS — G8929 Other chronic pain: Secondary | ICD-10-CM

## 2020-06-23 MED ORDER — PANTOPRAZOLE SODIUM 40 MG PO TBEC: 40.0000 mg | DELAYED_RELEASE_TABLET | Freq: Every day | ORAL | 5 refills | Status: DC

## 2020-06-23 NOTE — Progress Notes (Signed)
BP 110/75   Pulse 67   Temp 98.1 F (36.7 C) (Oral)   Ht 5' 2.5" (1.588 m)   Wt 184 lb 14.4 oz (83.9 kg)   LMP 06/20/2020   SpO2 99%   BMI 33.28 kg/m    Subjective:    Patient ID: Martha Williams, female    DOB: Nov 09, 1991, 29 y.o.   MRN: [id number]  HPI: [place] Voytko is a 29 y.o. female presenting on 06/23/2020 for comprehensive medical examination. She is accompanied by a formal in person interpreter for this visit.  Current medical complaints include:blister-like areas on her fingers and hands bilaterally. Epigastric pain. Left knee pain.   She currently lives with: Boyfriend and a friend.  Interim Problems from her last visit: no   She reports regular vision exams q1-5y: yes She reports regular dental exams q 41m: no Her diet consists of: She eats very little beef, but reports chicken and vegitables. She eats some fried foods.  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 She is currently sexually active with 1 partners. She denies concerns today about STI Contraception choices are: nothing.  She endorses concerns about skin changes today: blisters on the hands She denies concerns about bowel changes today She denies concerns about bladder changes today  Depression Screen done today and results listed below:  Depression screen [facility] 2/9 06/23/2020 01/10/2018 12/08/2017  Decreased Interest 1 2 2   Down, Depressed, Hopeless 1 0 2  PHQ - 2 Score 2 2 4   Altered sleeping 1 1 0  Tired, decreased energy 2 2 1   Change in appetite 2 1 1   Feeling bad or failure about yourself  1 0 1  Trouble concentrating 1 0 0  Moving slowly or fidgety/restless 1 1 1   Suicidal thoughts 0 0 1  PHQ-9 Score 10 7 9   Difficult doing work/chores Somewhat  difficult - -    She does not have a history of falls. I did not complete a risk assessment for falls. A plan of care for falls was not documented.  Epigastric Pain: Patient reports epigastric pain of a burning quality that has been ongoing for the last few weeks. She reports the pain gets a little better with food, but returns shortly thereafter. She is no longer taking pantoprazole or famotidine.   Left Knee Pain: She reports ongoing left knee pain while standing from a seated position, standing for long periods, and while walking. She reports the pain is of an aching quality and can be pretty severe. She has not been taking ibuprofen with no improvement.   Blisters: She reports the presence of intermittent blisters on her hands bilaterally that have been ongoing for the past several weeks. She states the blisters start out small and then increase in size and turn to a purple coloration. They are not painful until the purple coloration appears. They do not ever become pustulant and do not drain. She reports when they heal they leave a brown discoloration to the skin.   Past Medical History:  Past Medical History:  Diagnosis Date  . Depression   . Iron deficiency anemia 05/03/2019    Surgical History:  Past Surgical History:  Procedure Laterality Date  . NO PAST SURGERIES      Medications:  Current Outpatient Medications on File Prior to Visit  Medication Sig  . cetirizine (ZYRTEC) 10 MG chewable tablet Chew 1 tablet (10 mg total) by mouth daily.  . diazepam (VALIUM) 2 MG tablet Take 1-2 tablets (2-4 mg total) by mouth once as needed (1 hour prior to Pap / pelvic exam).  . ferrous sulfate 325 (65 FE) MG EC tablet Take 1 tablet (325 mg total) by mouth 2 (two) times daily with a meal.  . ibuprofen (ADVIL) 800 MG tablet Take 1 tablet (800 mg total) by mouth every 8 (eight) hours as needed for moderate pain or cramping.  . SUMAtriptan (IMITREX) 50 MG tablet Take 1 tablet (50 mg total)  by mouth every 2 (two) hours as needed for migraine. May repeat in 2 hours if headache persists or recurs.   No current facility-administered medications on file prior to visit.    Allergies:  No Known Allergies  Social History:  Social History   Socioeconomic History  . Marital status: Legally Separated    Spouse name: Not on file  . Number of children: 0  . Years of education: Not on file  . Highest education level: Not on file  Occupational History  . Occupation: Fabrication  Tobacco Use  . Smoking status: Never Smoker  . Smokeless tobacco: Never Used  Vaping Use  . Vaping Use: Never used  Substance and Sexual Activity  . Alcohol use: Yes    Comment: socially  . Drug use: No  . Sexual activity: Yes    Partners: Male    Birth control/protection: None  Other Topics Concern  . Not on file  Social History Narrative  . Not on file   Social Determinants of Health   Financial Resource Strain:   . Difficulty of Paying Living Expenses:   Food Insecurity:   . Worried About Programme researcher, broadcasting/film/videounning Out of Food in the Last Year:   . Baristaan Out of Food in the Last Year:   Transportation Needs:   . Freight forwarderLack of Transportation (Medical):   Marland Kitchen. Lack of Transportation (Non-Medical):   Physical Activity:   . Days of Exercise per Week:   . Minutes of Exercise per Session:   Stress:   . Feeling of Stress :   Social Connections:   . Frequency of Communication with Friends and Family:   . Frequency of Social Gatherings with Friends and Family:   . Attends Religious Services:   . Active Member of Clubs or Organizations:   . Attends BankerClub or Organization Meetings:   Marland Kitchen. Marital Status:   Intimate Partner Violence:   . Fear of Current or Ex-Partner:   . Emotionally Abused:   Marland Kitchen. Physically Abused:   . Sexually Abused:    Social History   Tobacco Use  Smoking Status Never Smoker  Smokeless Tobacco Never Used   Social History   Substance and Sexual Activity  Alcohol Use Yes   Comment: socially     Family History:  Family History  Problem Relation Age of Onset  . High blood pressure Mother   . Cancer Maternal Grandmother     Past medical history, surgical history, medications, allergies, family history and social history reviewed with patient today and changes made to appropriate areas of the chart.   All ROS negative except what is listed above and in the HPI.      Objective:    BP 110/75   Pulse 67   Temp 98.1 F (36.7 C) (Oral)   Ht 5' 2.5" (  1.588 m)   Wt 184 lb 14.4 oz (83.9 kg)   LMP 06/20/2020   SpO2 99%   BMI 33.28 kg/m   Wt Readings from Last 3 Encounters:  06/23/20 184 lb 14.4 oz (83.9 kg)  06/05/20 186 lb 11.2 oz (84.7 kg)  05/22/20 185 lb 1.6 oz (84 kg)    Physical Exam Vitals and nursing note reviewed.  Constitutional:      Appearance: Normal appearance. She is normal weight.  HENT:     Head: Normocephalic.     Right Ear: Tympanic membrane normal.     Left Ear: Tympanic membrane normal.     Nose: Nose normal.     Mouth/Throat:     Mouth: Mucous membranes are moist.     Pharynx: Oropharynx is clear.  Eyes:     Extraocular Movements: Extraocular movements intact.     Conjunctiva/sclera: Conjunctivae normal.     Pupils: Pupils are equal, round, and reactive to light.  Neck:     Vascular: No carotid bruit.  Cardiovascular:     Rate and Rhythm: Normal rate and regular rhythm.     Pulses: Normal pulses.     Heart sounds: Normal heart sounds.  Pulmonary:     Effort: Pulmonary effort is normal.     Breath sounds: Normal breath sounds.  Abdominal:     General: Abdomen is flat. Bowel sounds are normal.     Palpations: Abdomen is soft.     Tenderness: There is abdominal tenderness in the epigastric area. There is no right CVA tenderness or left CVA tenderness.  Musculoskeletal:     Cervical back: Normal range of motion and neck supple.     Right knee: Normal.     Left knee: Crepitus present. No swelling. Decreased range of motion. Tenderness  present over the medial joint line. No LCL laxity, MCL laxity, ACL laxity or PCL laxity.Normal alignment and normal patellar mobility. Normal pulse.     Right lower leg: No edema.     Left lower leg: No edema.     Comments: Tenderness noted to the left knee along the medial joint line consistent with the MCL. Crepitus is present on movement.  Pain with flexion and when weight is applied.   Lymphadenopathy:     Cervical: No cervical adenopathy.  Skin:    General: Skin is warm and dry.     Capillary Refill: Capillary refill takes less than 2 seconds.     Findings: Rash present. Rash is papular.     Comments: Scattered raised papule lesions approximately 1cm in diameter along the finger tips of the fingers and palms of the hands bilaterally. Various stages represented from newly formed, flesh colored to intermediate with a dark purple coloration, to healed with brown macular presence.   Neurological:     General: No focal deficit present.     Mental Status: She is alert and oriented to person, place, and time.  Psychiatric:        Mood and Affect: Mood normal.        Behavior: Behavior normal.        Thought Content: Thought content normal.        Judgment: Judgment normal.     Results for orders placed or performed in visit on 05/29/20  Hepatitis C Ab w/rfl RNA, PCR + Geno  Result Value Ref Range   Hepatitis C Ab NON-REACTIVE NON-REACTI   SIGNAL TO CUT-OFF 0.05 <1.00  Hepatitis B Surface AntiGEN  Result Value  Ref Range   Hepatitis B Surface Ag NON-REACTIVE NON-REACTI      Assessment & Plan:   1. Encounter for annual physical exam Will obtain lab work today to monitor liver function and white blood cell counts, which were abnormal at her last visit.  She is due for her pap, but on her menstrual cycle today and wishes to defer the pap until a later date.  Discussed regular health maintenance.  - CBC with Differential - COMPLETE METABOLIC PANEL WITH GFR  2. Gastroesophageal  reflux disease, unspecified whether esophagitis present Symptoms consistent with exacerbation of GERD symptoms. She is no longer taking PPI or acid reducer medication.  Discussed the importance of restarting this medication to help with symptoms- reduce intake of carbonated and spicy foods and caffeine.  Restart pantoprazole.  Follow-up if symptoms have not improved in 4 weeks.  - pantoprazole (PROTONIX) 40 MG tablet; Take 1 tablet (40 mg total) by mouth daily.  Dispense: 30 tablet; Refill: 5  3. Papules Papules of various stages of healing present on the palmar surface of the hands bilaterally of unknown origin.  Highly suspicious foreign body granuloma such as metal shards or wood splinters due to the patients work in an area where small shards of metal are present and frequent use of wooden handled paint brush.  No signs of infection present and no open lesions- there are purple appearing lesions, which are consistent with the foreign object moving to the surface of the skin.  Discussed wearing gloves while working and warm water soaks. If symptoms persist, consider biopsy or dermatology referral.    4. Chronic pain of left knee Left knee pain consistent with MCL weakness.  Discussed exercises to help with knee pain and use of brace to help stabilize the knee and provide support.  Ibuprofen may be used in addition to ice and specific exercises.  Follow-up with Dr. Karie Schwalbe if pain continues despite treatment.    Follow up plan: Return in about 1 year (around 06/23/2021) for Annual Exam or sooner if needed.   LABORATORY TESTING:  - Pap smear: Pap smear attempted at last annual exam, but was unable to finish due to pain. She is on her menstural period today- we will defer this test.  - STI testing: up to date  IMMUNIZATIONS:   - Tdap: Tetanus vaccination status reviewed: last tetanus booster 7-8 years ago. - Influenza: Postponed to flu season   PATIENT COUNSELING:   Advised to take 1 mg  of folate supplement per day if capable of pregnancy.   Sexuality: Discussed sexually transmitted diseases, partner selection, use of condoms, avoidance of unintended pregnancy  and contraceptive alternatives.   Advised to avoid cigarette smoking.  I discussed with the patient that most people either abstain from alcohol or drink within safe limits (<=14/week and <=4 drinks/occasion for males, <=7/weeks and <= 3 drinks/occasion for females) and that the risk for alcohol disorders and other health effects rises proportionally with the number of drinks per week and how often a drinker exceeds daily limits.  Discussed cessation/primary prevention of drug use and availability of treatment for abuse.   Diet: Encouraged to adjust caloric intake to maintain  or achieve ideal body weight, to reduce intake of dietary saturated fat and total fat, to limit sodium intake by avoiding high sodium foods and not adding table salt, and to maintain adequate dietary potassium and calcium preferably from fresh fruits, vegetables, and low-fat dairy products.    stressed the importance of regular  exercise  Injury prevention: Discussed safety belts, safety helmets, smoke detector, smoking near bedding or upholstery.   Dental health: Discussed importance of regular tooth brushing, flossing, and dental visits.    NEXT PREVENTATIVE PHYSICAL DUE IN 1 YEAR. Return in about 1 year (around 06/23/2021) for Annual Exam or sooner if needed.-- NEEDS PAP

## 2020-06-23 NOTE — Patient Instructions (Addendum)
Tome Protonix (tambin llamado pantoprazole) 20 mg [employer] para el dolor de [place]. Esto reducir el cido en su estmago y ayudar con Chief Technology Officer.   Mantenimiento de Radiographer, therapeutic en las mujeres Health Maintenance, Female Adoptar un estilo de vida saludable y recibir atencin preventiva son importantes para promover la salud y Counsellor. Consulte al mdico sobre:  El esquema adecuado para hacerse pruebas y exmenes peridicos.  Cosas que puede hacer por su cuenta para prevenir enfermedades y [employer]. Qu debo saber sobre la dieta, el peso y el ejercicio? Consuma una dieta saludable   Consuma una dieta que incluya muchas verduras, frutas, productos lcteos con bajo contenido de [place] y Associate Professor.  No consuma muchos alimentos ricos en grasas slidas, azcares agregados o sodio. Mantenga un peso saludable El ndice de masa muscular [facility]) se [place] para identificar problemas de [place]. Proporciona una estimacin de la grasa corporal basndose en el peso y la altura. Su mdico puede ayudarle a Engineer, site IMC y a Personnel officer o Pharmacologist un peso saludable. Haga ejercicio con regularidad Haga ejercicio con regularidad. Esta es una de las prcticas ms importantes que puede hacer por su salud. La mayora de los adultos deben seguir estas pautas:  Education officer, environmental, al menos, de actividad fsica por semana. El ejercicio debe aumentar la frecuencia cardaca y Media planner transpirar (ejercicio de intensidad moderada).  Hacer ejercicios de fortalecimiento por lo [employer] por semana. Agregue esto a su plan de ejercicio de intensidad moderada.  Pasar menos tiempo sentados. Incluso la actividad fsica ligera puede ser beneficiosa. Controle sus niveles de colesterol y lpidos en la sangre Comience a realizarse anlisis de lpidos y Oncologist en la sangre a los 20aos y luego reptalos cada 5aos. Hgase controlar los niveles de colesterol con mayor frecuencia si:  Sus niveles  de lpidos y colesterol son altos.  Es mayor de 40aos.  Presenta un alto riesgo de padecer enfermedades cardacas. Qu debo saber sobre las pruebas de deteccin del cncer? Segn su historia clnica y sus antecedentes familiares, es posible que deba realizarse pruebas de deteccin del cncer en diferentes edades. Esto puede incluir pruebas de deteccin de lo siguiente:  Cncer de mama.  Cncer de cuello uterino.  Cncer colorrectal.  Cncer de piel.  Cncer de pulmn. Qu debo saber sobre la enfermedad cardaca, la diabetes y la hipertensin arterial? Presin arterial y enfermedad cardaca  La hipertensin arterial causa enfermedades cardacas y [place] el riesgo de accidente cerebrovascular. Es ms probable que esto se manifieste en las personas que tienen lecturas de presin arterial alta, tienen ascendencia africana o tienen sobrepeso.  Hgase controlar la presin arterial: ? Cada 3 a 5 aos si tiene entre 18 y 37 aos. ? Todos los aos si es mayor de [place]. Diabetes Realcese exmenes de deteccin de la diabetes con regularidad. Este anlisis revisa el nivel de azcar en la sangre en [place]. Hgase las pruebas de deteccin:  Cada tresaos despus de los 40aos de edad si tiene un peso normal y un bajo riesgo de padecer diabetes.  Con ms frecuencia y a partir de [place] edad inferior si tiene sobrepeso o un alto riesgo de padecer diabetes. Qu debo saber sobre la prevencin de infecciones? Hepatitis B Si tiene un riesgo ms alto de contraer hepatitis B, debe someterse a un examen de deteccin de este virus. Hable con el mdico para averiguar si tiene riesgo de contraer la infeccin por hepatitis B. Hepatitis C Se recomienda el anlisis a:  Todos los que nacieron entre 1945 y [phone].  Todas las personas que tengan un riesgo de haber contrado hepatitis C. Enfermedades de transmisin sexual (ETS)  Hgase las pruebas de Airline pilot de ITS, incluidas la gonorrea y la  clamidia, si: ? Es sexualmente activa y es menor de [place]. ? Es mayor de 24aos, y Public affairs consultant informa que corre riesgo de tener este tipo de infecciones. ? La actividad sexual ha cambiado desde que le hicieron la ltima prueba de deteccin y tiene un riesgo mayor de Warehouse manager clamidia o Copy. Pregntele al mdico si usted tiene riesgo.  Pregntele al mdico si usted tiene un alto riesgo de Primary school teacher VIH. El mdico tambin puede recomendarle un medicamento recetado para ayudar a evitar la infeccin por el VIH. Si elige tomar medicamentos para prevenir el VIH, primero debe [employer] de deteccin del VIH. Luego debe hacerse anlisis cada mientras est tomando los medicamentos. Embarazo  Si est por dejar de Armed forces training and education officer (fase premenopusica) y usted puede quedar [place], busque asesoramiento antes de [place].  Tome de 400 a (mcg) de cido [employer] si [place].  Pida mtodos de control de la natalidad (anticonceptivos) si desea evitar un embarazo no deseado. Osteoporosis y [place] La osteoporosis es una enfermedad en la que los huesos pierden los minerales y la fuerza por el avance de la edad. El resultado pueden ser fracturas en los [place]. Si tiene 65aos o ms, o si est en riesgo de sufrir osteoporosis y fracturas, pregunte a su mdico si debe:  Hacerse pruebas de deteccin de prdida sea.  Tomar un suplemento de calcio o de vitamina D para reducir el riesgo de fracturas.  Recibir terapia de reemplazo hormonal (TRH) para tratar los sntomas de la menopausia. Siga estas instrucciones en su casa: Estilo de vida  No consuma ningn producto que contenga nicotina o tabaco, como cigarrillos, cigarrillos electrnicos y tabaco de Theatre manager. Si necesita ayuda para dejar de fumar, consulte al mdico.  No consuma drogas.  No comparta agujas.  Solicite ayuda a su mdico si necesita apoyo o informacin para abandonar las  drogas. Consumo de alcohol  No beba alcohol si: ? Su mdico le indica no hacerlo. ? Est embarazada, puede estar embarazada o est tratando de quedar embarazada.  Si bebe alcohol: ? Limite la cantidad que consume de 0 a 1 medida por da. ? Limite la ingesta si est amamantando.  Est atento a la cantidad de alcohol que hay en las bebidas que toma. En los [place], una medida equivale a una botella de cerveza de 12oz ( ), un vaso de vino de 5oz ( ) o un vaso de una bebida alcohlica de alta graduacin de 1oz (12ml). Instrucciones generales  Realcese los estudios de rutina de la salud, dentales y de Wellsite geologist.  Mantngase al da con las vacunas.  Infrmele a su mdico si: ? Se siente deprimida con frecuencia. ? Alguna vez ha sido vctima de [place] o no se siente segura en su casa. Resumen  Adoptar un estilo de vida saludable y recibir atencin preventiva son importantes para promover la salud y Counsellor.  Siga las instrucciones del mdico acerca de una dieta saludable, el ejercicio y la realizacin de pruebas o exmenes para Hotel manager.  Siga las instrucciones del mdico con respecto al control del colesterol y la presin arterial. Esta informacin no tiene Theme park manager el consejo del mdico. Asegrese de hacerle al mdico cualquier pregunta que tenga. Document Revised:  11/22/2018 Document Reviewed: 11/22/2018 Elsevier Patient Education  2020 [employer].

## 2020-06-25 NOTE — Progress Notes (Signed)
Spanish Speaking only: Martha Williams, can you please call her?  Labs looking a little better. It looks like you may have low iron. I am going to see if we can add a lab test to look at this. Your liver function is still elevated, but it is looking much better. Please avoid Tylenol and Alcohol and we will look at this again in a few months to make sure it is decreasing.    Please add TIBC/Iron/Ferritin to labs

## 2020-06-26 ENCOUNTER — Other Ambulatory Visit: Payer: Self-pay | Admitting: Nurse Practitioner

## 2020-06-26 ENCOUNTER — Other Ambulatory Visit: Payer: Self-pay

## 2020-06-26 DIAGNOSIS — K219 Gastro-esophageal reflux disease without esophagitis: Secondary | ICD-10-CM

## 2020-06-26 LAB — COMPLETE METABOLIC PANEL WITH GFR
AG Ratio: 1.1 (calc) (ref 1.0–2.5)
ALT: 92 U/L — ABNORMAL HIGH (ref 6–29)
AST: 77 U/L — ABNORMAL HIGH (ref 10–30)
Albumin: 4 g/dL (ref 3.6–5.1)
Alkaline phosphatase (APISO): 73 U/L (ref 31–125)
BUN/Creatinine Ratio: 9 (calc) (ref 6–22)
BUN: 5 mg/dL — ABNORMAL LOW (ref 7–25)
CO2: 25 mmol/L (ref 20–32)
Calcium: 8.7 mg/dL (ref 8.6–10.2)
Chloride: 107 mmol/L (ref 98–110)
Creat: 0.56 mg/dL (ref 0.50–1.10)
GFR, Est African American: 148 mL/min/{1.73_m2} (ref >=60)
GFR, Est Non African American: 128 mL/min/{1.73_m2} (ref >=60)
Globulin: 3.6 g/dL (calc) (ref 1.9–3.7)
Glucose, Bld: 97 mg/dL (ref 65–99)
Potassium: 4 mmol/L (ref 3.5–5.3)
Sodium: 138 mmol/L (ref 135–146)
Total Bilirubin: 0.4 mg/dL (ref 0.2–1.2)
Total Protein: 7.6 g/dL (ref 6.1–8.1)

## 2020-06-26 LAB — IRON,TIBC AND FERRITIN PANEL
%SAT: 12 % (calc) — ABNORMAL LOW (ref 16–45)
Ferritin: 24 ng/mL (ref 16–154)
Iron: 43 ug/dL (ref 40–190)
TIBC: 358 mcg/dL (calc) (ref 250–450)

## 2020-06-26 LAB — CBC WITH DIFFERENTIAL/PLATELET
Absolute Monocytes: 314 cells/uL (ref 200–950)
Basophils Absolute: 20 cells/uL (ref 0–200)
Basophils Relative: 0.6 %
Eosinophils Absolute: 122 cells/uL (ref 15–500)
Eosinophils Relative: 3.7 %
HCT: 37 % (ref 35.0–45.0)
Hemoglobin: 12 g/dL (ref 11.7–15.5)
Lymphs Abs: 1218 cells/uL (ref 850–3900)
MCH: 25 pg — ABNORMAL LOW (ref 27.0–33.0)
MCHC: 32.4 g/dL (ref 32.0–36.0)
MCV: 77.1 fL — ABNORMAL LOW (ref 80.0–100.0)
MPV: 10.3 fL (ref 7.5–12.5)
Monocytes Relative: 9.5 %
Neutro Abs: 1627 cells/uL (ref 1500–7800)
Neutrophils Relative %: 49.3 %
Platelets: 267 10*3/uL (ref 140–400)
RBC: 4.8 10*6/uL (ref 3.80–5.10)
RDW: 16.8 % — ABNORMAL HIGH (ref 11.0–15.0)
Total Lymphocyte: 36.9 %
WBC: 3.3 10*3/uL — ABNORMAL LOW (ref 3.8–10.8)

## 2020-06-26 MED ORDER — FERROUS SULFATE 325 (65 FE) MG PO TBEC: 325.0000 mg | DELAYED_RELEASE_TABLET | Freq: Two times a day (BID) | ORAL | 1 refills | Status: DC

## 2020-06-26 MED ORDER — FERROUS SULFATE 325 (65 FE) MG PO TBEC: 325.0000 mg | DELAYED_RELEASE_TABLET | Freq: Two times a day (BID) | ORAL | 1 refills | Status: AC

## 2020-06-26 MED ORDER — OMEPRAZOLE 40 MG PO CPDR: 40.0000 mg | DELAYED_RELEASE_CAPSULE | Freq: Two times a day (BID) | ORAL | 11 refills | Status: AC

## 2020-06-26 NOTE — Progress Notes (Signed)
Martha Williams can you please call the patient with the message? She is only spanish speaking.   Your blood work shows that you are anemic. I would like for you to start taking an iron supplement to help with this. Look for iron that says "Ferrous Sulfate". I would like you to take 325mg  of Ferrous Sulfate every day. We need to do this for at least 3 months and then we can recheck your blood.   This can cause constipation. To help prevent this, be sure you are drinking plenty of water and you can take a stool softener like Colace (also called docusate sodium) every day. The stool softener will not cramp your stomach or cause diarrhea, it will just help your stool from being hard and difficult to pass.

## 2020-08-06 ENCOUNTER — Encounter: Payer: Self-pay | Admitting: Nurse Practitioner

## 2021-01-05 IMAGING — CT CT ABD-PELV W/ CM
2 of 8 series · 14 of 46 positions shown, 18 images · IV contrast (APPLIED)
Comparison: None

CLINICAL DATA: RIGHT lower quadrant abdominal pain and RIGHT mid
abdominal pain since [REDACTED], nausea, chills, question appendicitis
for

EXAM:
CT ABDOMEN AND PELVIS WITH CONTRAST
TECHNIQUE: Multidetector CT imaging of the abdomen and pelvis was performed
using the standard protocol following bolus administration of
intravenous contrast. Sagittal and coronal MPR images reconstructed
from axial data set.
CONTRAST:  100mL OMNIPAQUE IOHEXOL 300 MG/ML SOLN IV. Dilute oral
contrast.

[Series 2: axial st · axial · 0.81mm/px · z∈[-498,-98]mm · 11 of 97 slices shown, 15 images]
[im 11/97  soft-tissue]
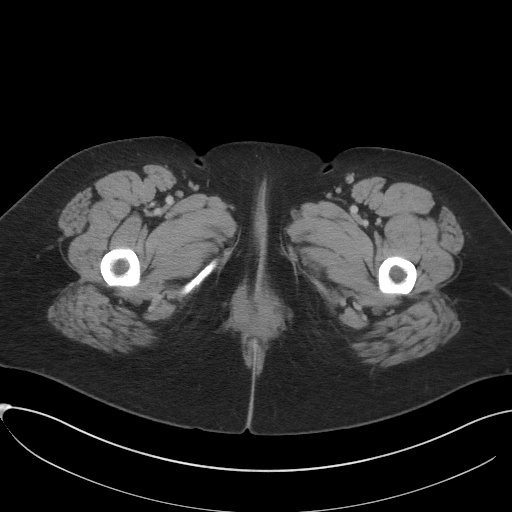
[im 11/97  bone]
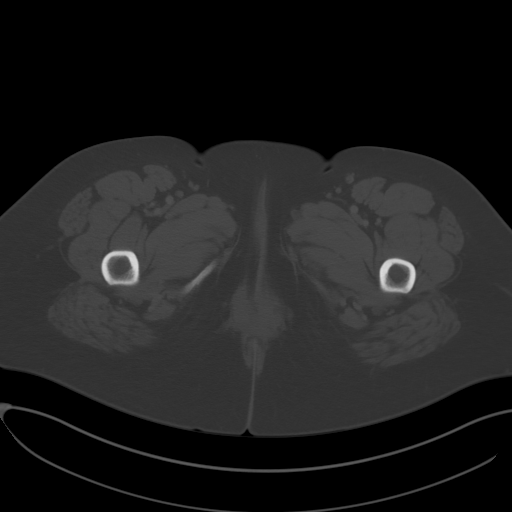
[im 21/97  soft-tissue]
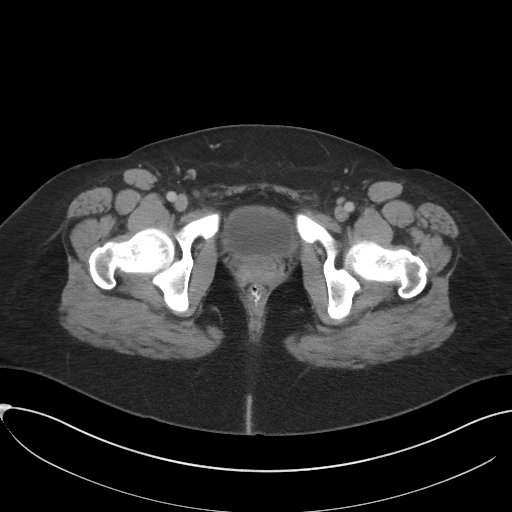
[im 31/97  soft-tissue]
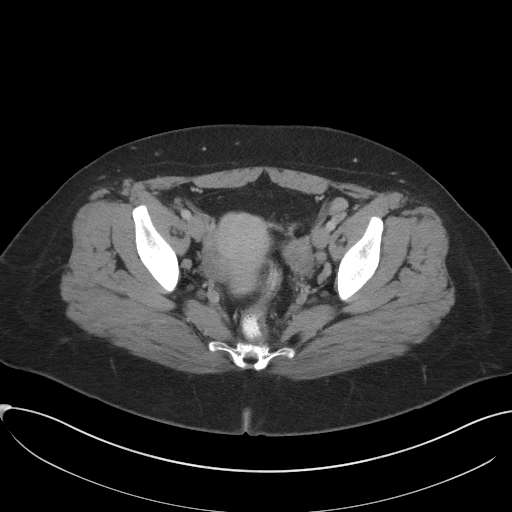
[im 41/97  soft-tissue]
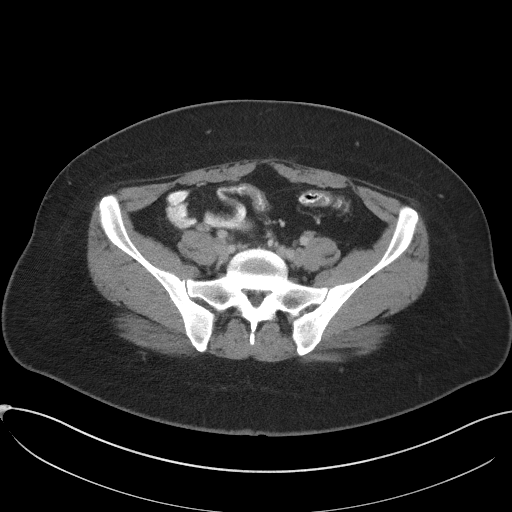
[im 51/97  soft-tissue]
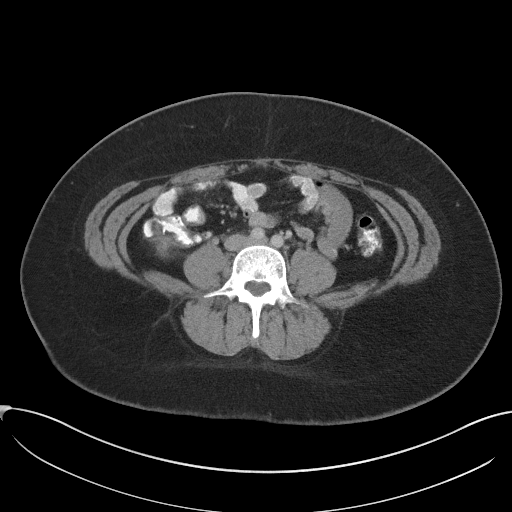
[im 61/97  soft-tissue]
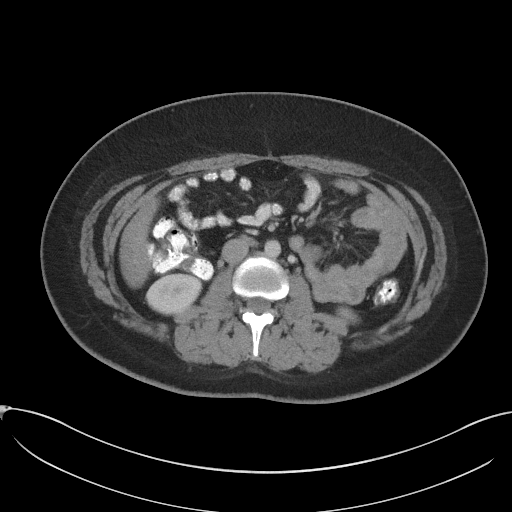
[im 71/97  soft-tissue]
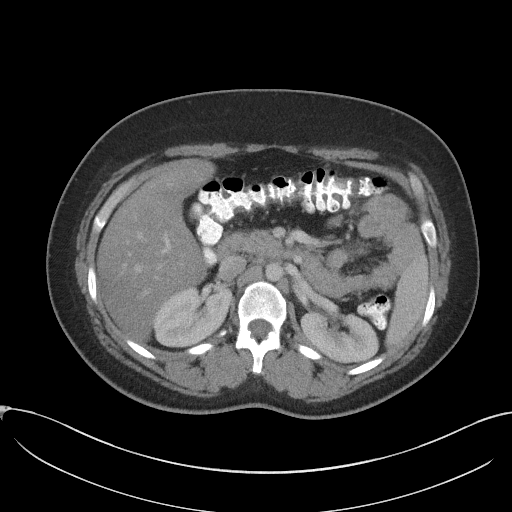
[im 76/97  lung]
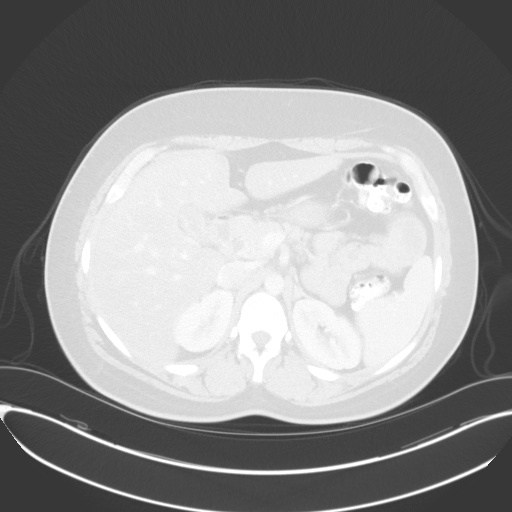
[im 81/97  soft-tissue]
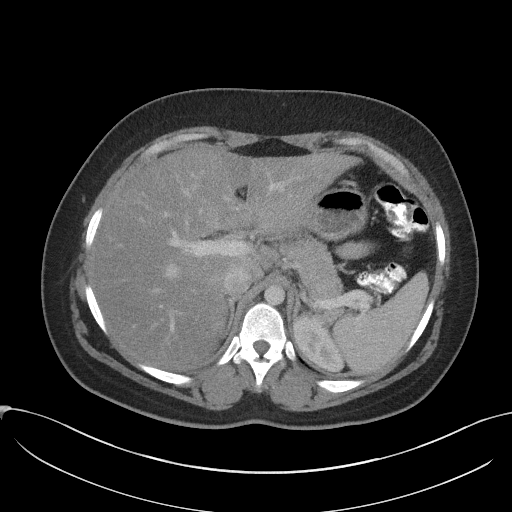
[im 81/97  lung]
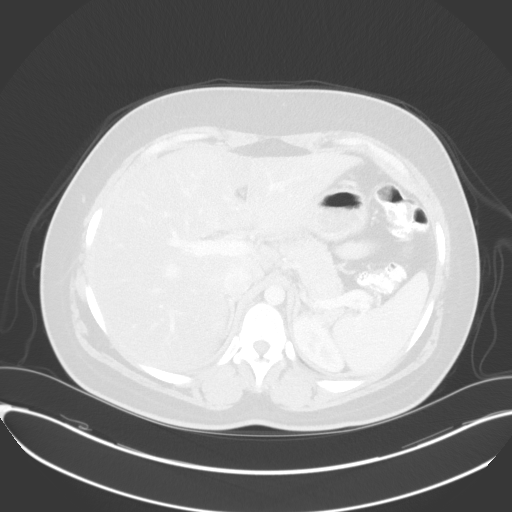
[im 86/97  lung]
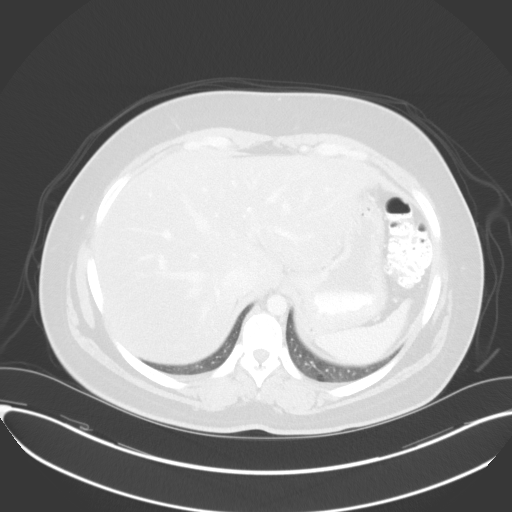
[im 91/97  soft-tissue]
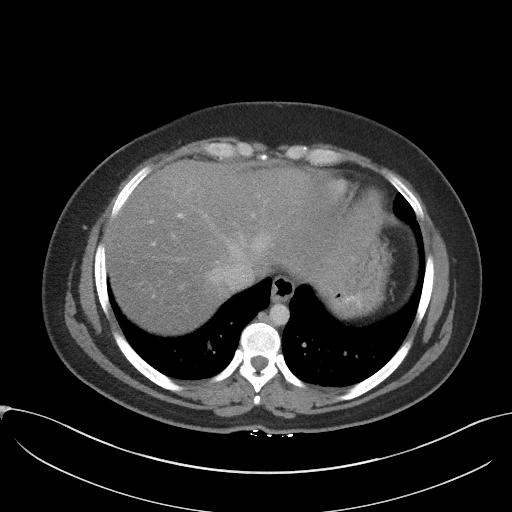
[im 91/97  lung]
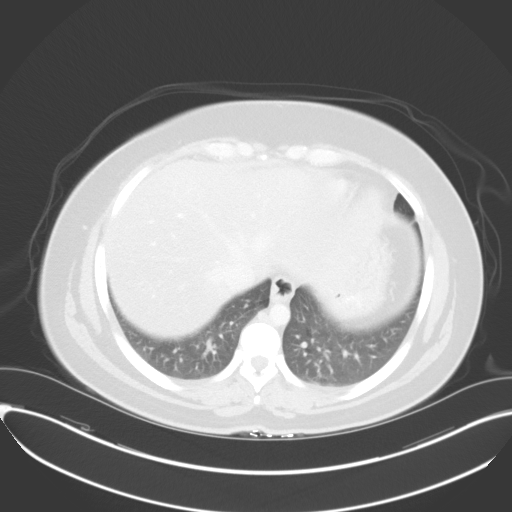
[im 91/97  bone]
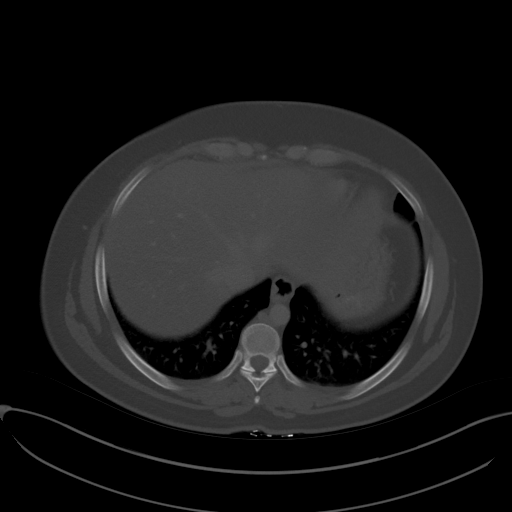

[Series 5: coronal st · coronal · 0.85mm/px · 3 of 90 slices shown]
[im 23/90  soft-tissue]
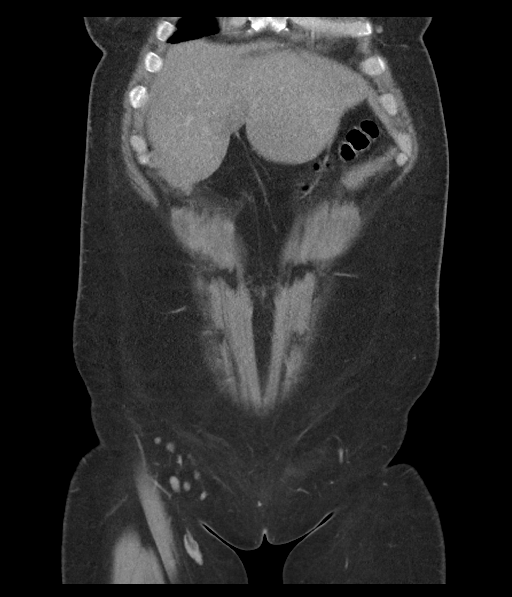
[im 45/90  soft-tissue]
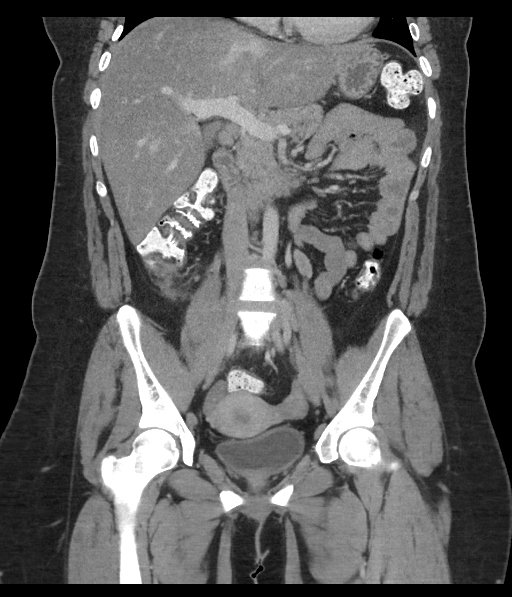
[im 67/90  soft-tissue]
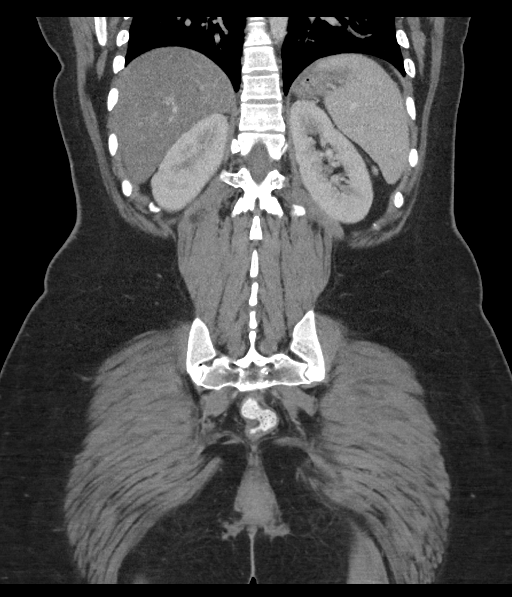

[14 of 46 positions shown; findings below may reference images not displayed]

FINDINGS: Lower chest: Lung bases clear

Hepatobiliary: Thickened gallbladder wall. Gallbladder incompletely
distended. Hazy pericholecystic fat. Question of cholecystitis
raised. Fatty infiltration of liver. Focal fatty infiltration
adjacent to falciform fissure.

Pancreas: Normal appearance

Spleen: Normal appearance

Adrenals/Urinary Tract: Adrenal glands, kidneys, ureters, and
bladder normal appearance

Stomach/Bowel: Normal appendix. Stomach and bowel loops normal
appearance

Vascular/Lymphatic: Vascular structures patent. Aorta normal
caliber. No adenopathy.

Reproductive: Unremarkable uterus and ovaries for age

Other: No free air. Tiny amount of free fluid adjacent to RIGHT
ovary. No hernia or additional inflammatory process.

Musculoskeletal: Unremarkable
IMPRESSION: Normal appendix.

Thickened gallbladder wall with hazy pericholecystic fat is highly
suggestive of acute cholecystitis; recommend ultrasound assessment.

Remainder of exam unremarkable.

## 2021-01-05 IMAGING — US US ABDOMEN LIMITED
1 series · 14 of 25 positions shown · non-contrast
Comparison: Current abdomen and pelvis CT.

CLINICAL DATA: Nausea for 6 days. Current abdomen and pelvis CT
demonstrates a thickened gallbladder wall with findings concerning
for acute cholecystitis.

EXAM:
ULTRASOUND ABDOMEN LIMITED RIGHT UPPER QUADRANT

[Series 1: us abdomen limited · 0.29mm/px · 80 acquisitions, 14 frames shown]
[im 1/80]
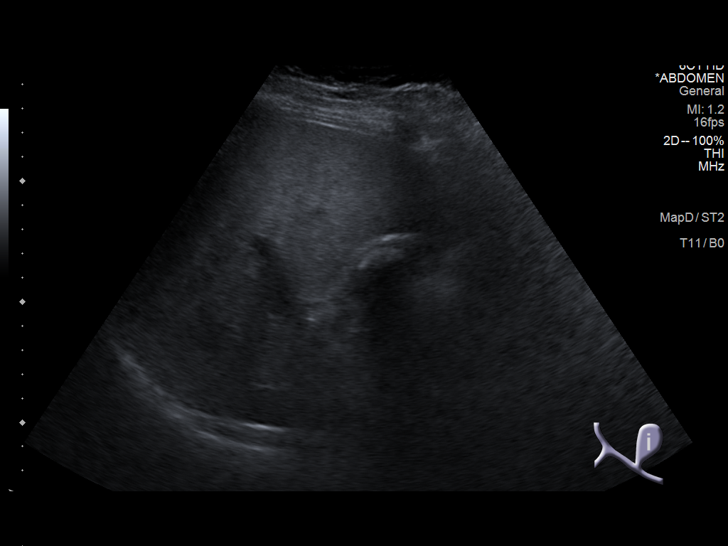
[im 7/80]
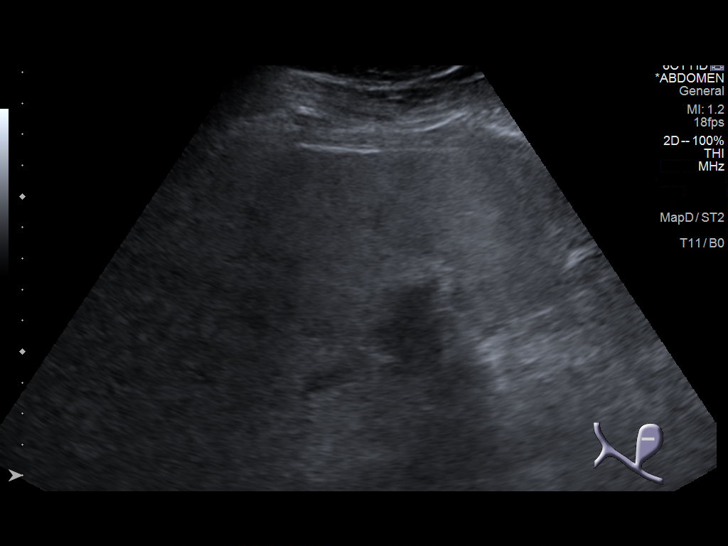
[im 14/80]
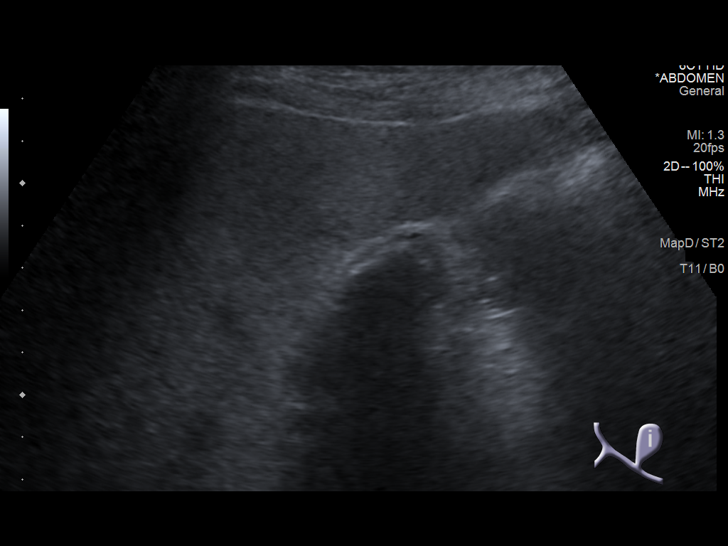
[im 20/80]
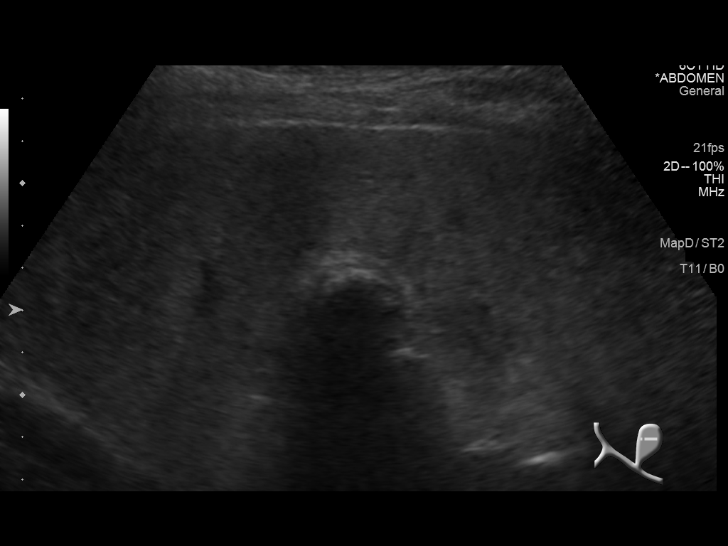
[im 27/80]
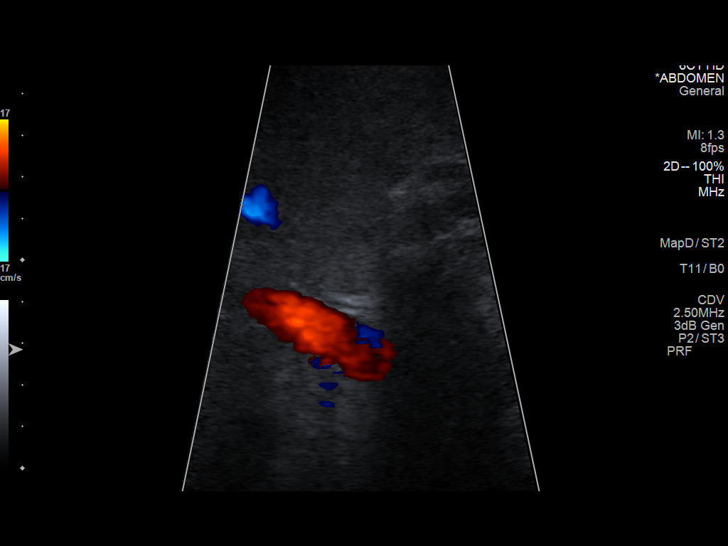
[im 30/80]
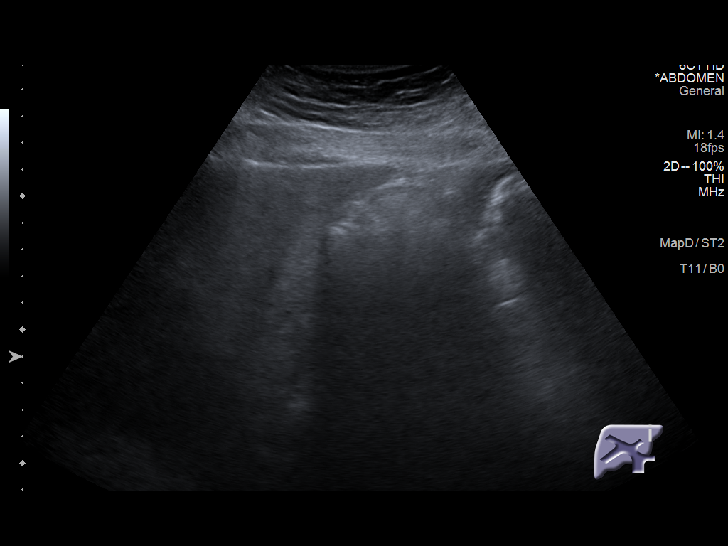
[im 37/80]
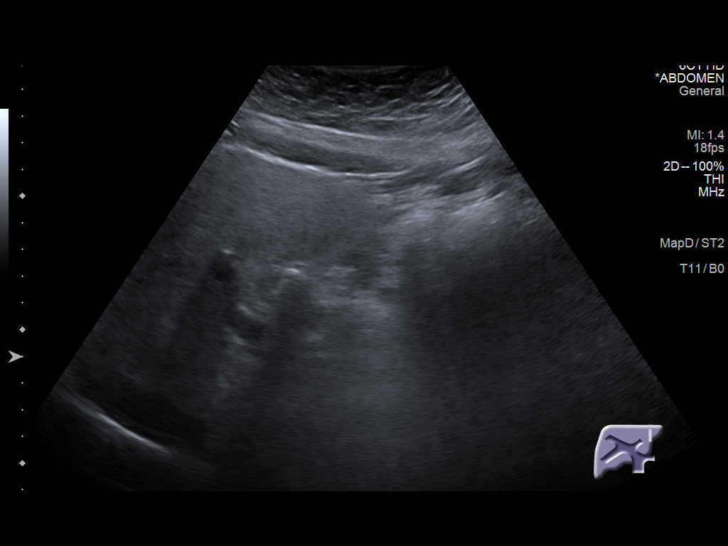
[im 43/80]
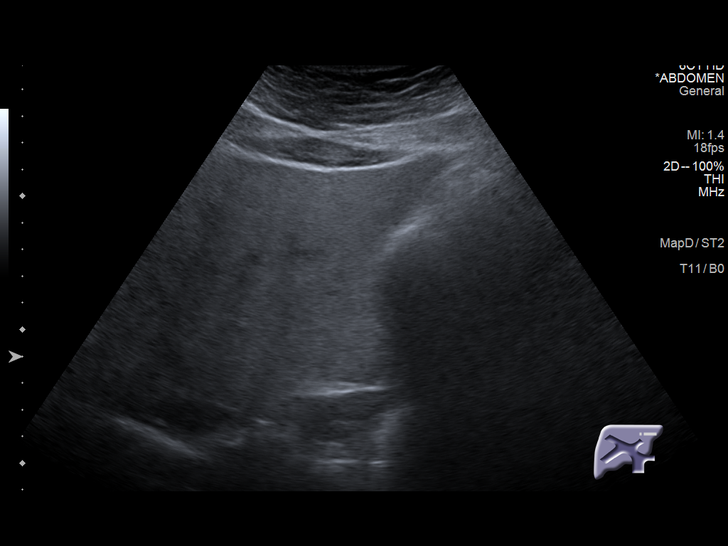
[im 50/80]
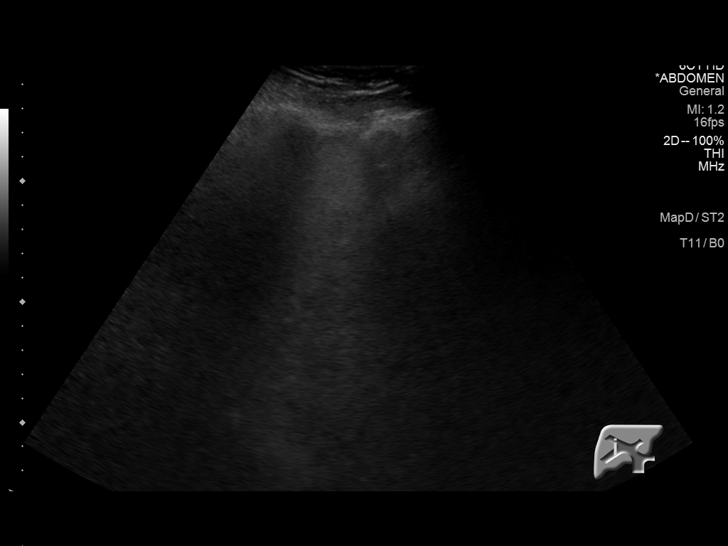
[im 53/80]
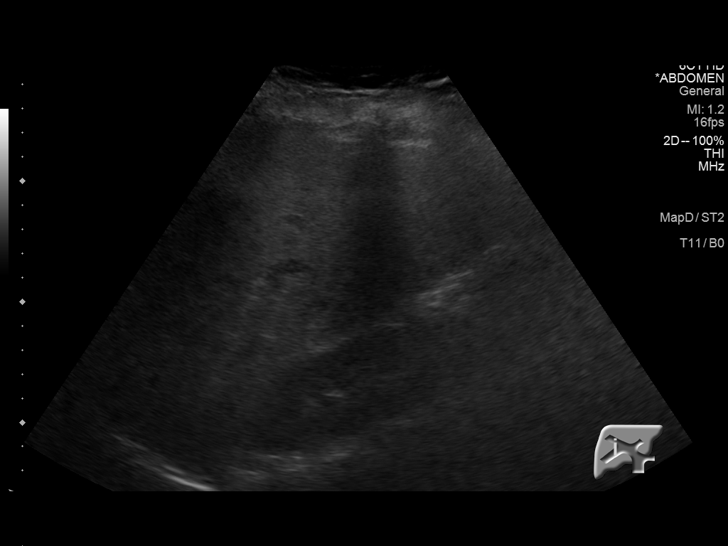
[im 60/80]
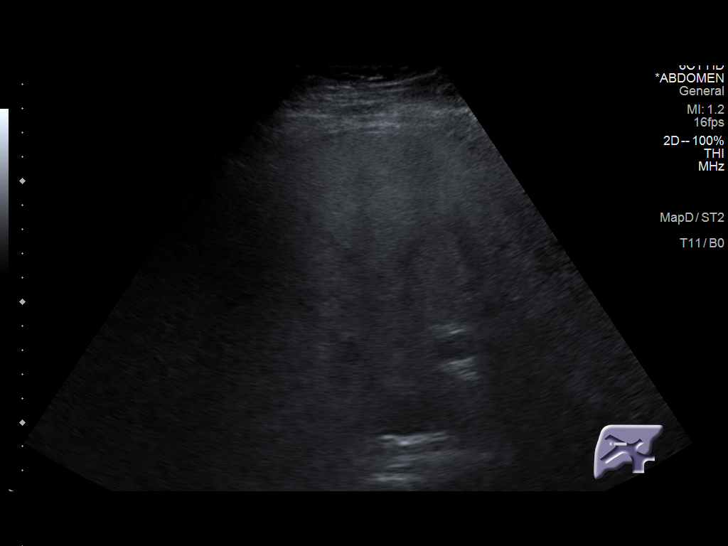
[im 66/80]
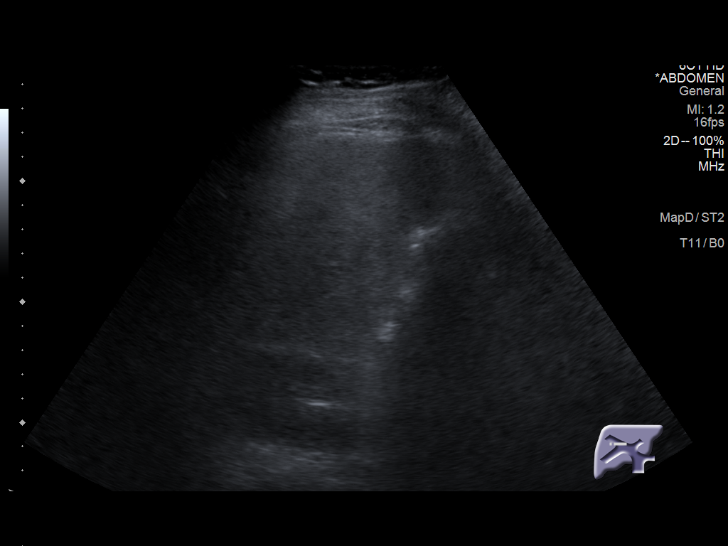
[im 73/80]
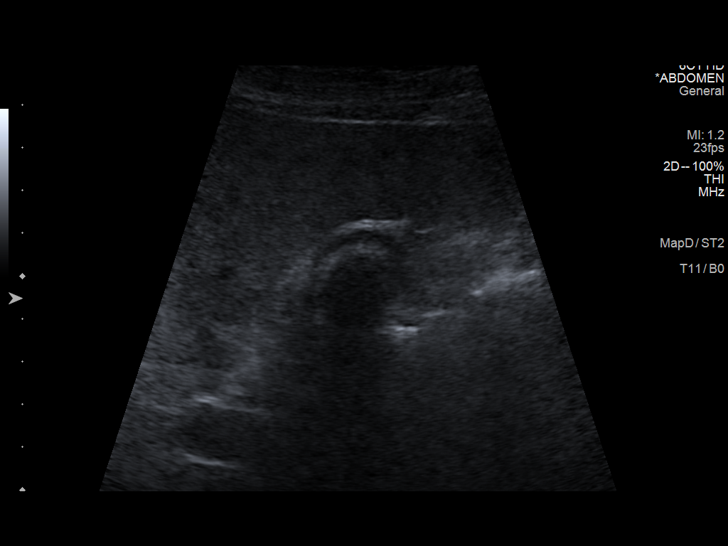
[im 80/80]
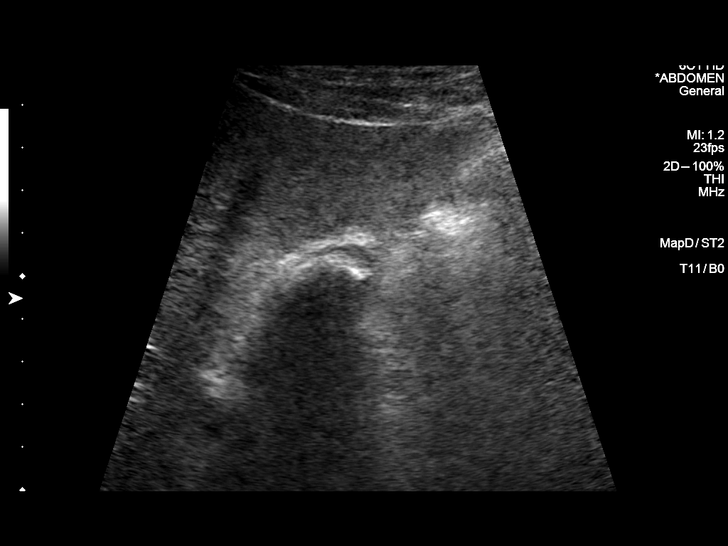

[14 of 25 positions shown; findings below may reference images not displayed]

FINDINGS: Gallbladder:

Poorly visualized. There are multiple stones leading to significant
shadowing. The anterior gallbladder wall measures 3 mm. Possible
trace pericholecystic fluid. No sonographic Murphy's sign. Largest
stone measures 1.8 cm.

Common bile duct:

Diameter: 2 mm

Liver:

Increased parenchymal echogenicity. Normal in overall size. No mass
or focal lesion. Portal vein is patent on color Doppler imaging with
normal direction of blood flow towards the liver.

Other: None.
IMPRESSION: 1. Multiple gallstones with significant posterior acoustic
shadowing, leading to a wall, echo, shadow complex, which limits
sonographic visualization of the gallbladder. The visualized
anterior gallbladder wall measures 3 mm in thickness. There is
evidence of a trace amount of pericholecystic fluid, but no
sonographic Murphy's sign. No definite acute cholecystitis.
2. Hepatic steatosis.  No bile duct dilation

## 2021-04-20 ENCOUNTER — Telehealth: Payer: Self-pay | Admitting: General Practice

## 2021-04-20 NOTE — Telephone Encounter (Signed)
Transition Care Management Unsuccessful Follow-up Telephone Call  Date of discharge and from where:  [facility] 04/16/21  Attempts:  1st Attempt  Reason for unsuccessful TCM follow-up call:  Left voice message    

## 2021-04-21 NOTE — Telephone Encounter (Signed)
Transition Care Management Follow-up Telephone Call  Date of discharge and from where: [facility] 04/16/21  How have you been since you were released from the hospital? Doing good  Any questions or concerns? No  Items Reviewed:  Did the pt receive and understand the discharge instructions provided? Yes   Medications obtained and verified? No   Other? No   Any new allergies since your discharge? No   Dietary orders reviewed? No  Do you have support at home? Yes   Home Care and Equipment/Supplies: Were home health services ordered? no   Functional Questionnaire: (I = Independent and D = Dependent) ADLs: I  Bathing/Dressing- I  Meal Prep- I  Eating- I  Maintaining continence- I  Transferring/Ambulation- I  Managing Meds- I  Follow up appointments reviewed:   PCP Hospital f/u appt confirmed? No    Specialist Hospital f/u appt confirmed? No    Are transportation arrangements needed? No   If their condition worsens, is the pt aware to call PCP or go to the Emergency Dept.? Yes  Was the patient provided with contact information for the PCP's office or ED? Yes  Was to pt encouraged to call back with questions or concerns? Yes

## 2021-11-11 ENCOUNTER — Telehealth: Payer: Self-pay | Admitting: General Practice

## 2021-11-11 NOTE — Telephone Encounter (Signed)
Transition Care Management Follow-up Telephone Call Date of discharge and from where: 11/10/21 from [facility] How have you been since you were released from the hospital? OB patient scheduled C section. Will follow up with OB. Any questions or concerns? No

## 2022-01-01 ENCOUNTER — Telehealth: Payer: Self-pay | Admitting: General Practice

## 2022-01-01 NOTE — Telephone Encounter (Signed)
Transition Care Management Follow-up Telephone Call Date of discharge and from where: 12/30/21 from [facility] How have you been since you were released from the hospital? Patient's brother in law was the interpreter. Patient stated that she has a different PCP. Will remove Dr. Lyn Hollingshead as PCP. Any questions or concerns? No
